# Patient Record
Sex: Male | Born: 1975 | Race: Black or African American | Hispanic: No
Health system: Southern US, Community
[De-identification: ages and names within clinical notes are randomized; demographics above are authoritative.]

## PROBLEM LIST (undated history)

## (undated) DIAGNOSIS — M1990 Unspecified osteoarthritis, unspecified site: Secondary | ICD-10-CM

## (undated) DIAGNOSIS — F32A Depression, unspecified: Secondary | ICD-10-CM

## (undated) DIAGNOSIS — M5126 Other intervertebral disc displacement, lumbar region: Secondary | ICD-10-CM

## (undated) DIAGNOSIS — I10 Essential (primary) hypertension: Secondary | ICD-10-CM

## (undated) HISTORY — DX: Depression, unspecified: F32.A

---

## 2016-08-18 ENCOUNTER — Encounter (HOSPITAL_COMMUNITY): Payer: Self-pay

## 2016-08-18 ENCOUNTER — Emergency Department (HOSPITAL_COMMUNITY)
Admission: EM | Admit: 2016-08-18 | Discharge: 2016-08-18 | Disposition: A | Discharge status: Home / Self Care | Payer: Worker's Compensation | Attending: Emergency Medicine | Admitting: Emergency Medicine

## 2016-08-18 DIAGNOSIS — Y999 Unspecified external cause status: Secondary | ICD-10-CM | POA: Diagnosis not present

## 2016-08-18 DIAGNOSIS — Y9289 Other specified places as the place of occurrence of the external cause: Secondary | ICD-10-CM | POA: Diagnosis not present

## 2016-08-18 DIAGNOSIS — X509XXA Other and unspecified overexertion or strenuous movements or postures, initial encounter: Secondary | ICD-10-CM | POA: Diagnosis not present

## 2016-08-18 DIAGNOSIS — M545 Low back pain, unspecified: Secondary | ICD-10-CM

## 2016-08-18 DIAGNOSIS — Y9389 Activity, other specified: Secondary | ICD-10-CM | POA: Diagnosis not present

## 2016-08-18 DIAGNOSIS — I10 Essential (primary) hypertension: Secondary | ICD-10-CM | POA: Insufficient documentation

## 2016-08-18 DIAGNOSIS — F17200 Nicotine dependence, unspecified, uncomplicated: Secondary | ICD-10-CM | POA: Diagnosis not present

## 2016-08-18 HISTORY — DX: Essential (primary) hypertension: I10

## 2016-08-18 HISTORY — DX: Other intervertebral disc displacement, lumbar region: M51.26

## 2016-08-18 MED ORDER — KETOROLAC TROMETHAMINE 60 MG/2ML IM SOLN
30.0000 mg | Freq: Once | INTRAMUSCULAR | Status: AC
  Administered 2016-08-18: 30 mg via INTRAMUSCULAR
  Filled 2016-08-18: qty 2

## 2016-08-18 MED ORDER — METHOCARBAMOL 500 MG PO TABS: 500.0000 mg | ORAL_TABLET | Freq: Two times a day (BID) | ORAL | 0 refills | Status: DC

## 2016-08-18 MED ORDER — NAPROXEN 500 MG PO TABS: 500.0000 mg | ORAL_TABLET | Freq: Two times a day (BID) | ORAL | 0 refills | Status: AC

## 2016-08-18 NOTE — ED Provider Notes (Signed)
[facility]-EMERGENCY DEPT Provider Note   CSN: [id number] Arrival date & time: 08/18/16  2053  By signing my name below, I, Rodney Gallagher, attest that this documentation has been prepared under the direction and in the presence of non-physician practitioner, Glenford Bayley, PA-C. Electronically Signed: Modena Gallagher, Scribe. 08/18/2016. 9:31 PM.  History   Chief Complaint Chief Complaint  Patient presents with  . Back Pain   The history is provided by the patient. No language interpreter was used.   HPI Comments: Rodney Gallagher is a 41 y.o. male with history of lumbar disc herniation who presents to the Emergency Department complaining of constant moderate lower left back pain that started today after lifting an object at work. Patient also reports he was shoveling sand all day yesterday. He takes tramadol and naproxen (last dose: 3pm today) daily for a herniated lumbar disc (followed up by PCP). His pain today was unrelieved his daily pain medications. He describes the pain as a sharp sensation exacerbated by movement. He denies any gait problem, numbness/tingling, bowel/bladder incontinence, saddle paresthesia, recent surgery, hx of IV drug use, hx of cancer, or other complaints.   Past Medical History:  Diagnosis Date  . Herniation of lumbar intervertebral disc   . Hypertension     There are no active problems to display for this patient.   History reviewed. No pertinent surgical history.     Home Medications    Prior to Admission medications   Medication Sig Start Date End Date Taking? Authorizing Provider  methocarbamol (ROBAXIN) 500 MG tablet Take 1 tablet (500 mg total) by mouth 2 (two) times daily. 08/18/16   Emi Holes, PA-C  naproxen (NAPROSYN) 500 MG tablet Take 1 tablet (500 mg total) by mouth 2 (two) times daily. 08/18/16   Emi Holes, PA-C    Family History No family history on file.  Social History Social History  Substance Use Topics  . Smoking status: Current  Every Day Smoker  . Smokeless tobacco: Never Used  . Alcohol use No     Allergies   Patient has no known allergies.   Review of Systems Review of Systems  Constitutional: Negative for chills and fever.  HENT: Negative for facial swelling and sore throat.   Respiratory: Negative for shortness of breath.   Cardiovascular: Negative for chest pain.  Gastrointestinal: Negative for abdominal pain, nausea and vomiting.  Genitourinary: Negative for dysuria.  Musculoskeletal: Positive for back pain (Lower, left). Negative for gait problem.  Skin: Negative for rash and wound.  Neurological: Negative for numbness and headaches.  Psychiatric/Behavioral: The patient is not nervous/anxious.      Physical Exam Updated Vital Signs BP (!) 164/109 (BP Location: Right Arm)   Pulse 78   Temp 97.7 F (36.5 C) (Oral)   Resp 18   Ht 5\' 11"  (1.803 m)   Wt 250 lb (113.4 kg)   SpO2 98%   BMI 34.87 kg/m   Physical Exam  Constitutional: He appears well-developed and well-nourished. No distress.  Patient ambulating without difficulty  HENT:  Head: Normocephalic and atraumatic.  Mouth/Throat: Oropharynx is clear and moist. No oropharyngeal exudate.  Eyes: Conjunctivae are normal. Pupils are equal, round, and reactive to light. Right eye exhibits no discharge. Left eye exhibits no discharge. No scleral icterus.  Neck: Normal range of motion. Neck supple. No thyromegaly present.  Cardiovascular: Normal rate, regular rhythm, normal heart sounds and intact distal pulses.  Exam reveals no gallop and no friction rub.   No murmur  heard. Pulmonary/Chest: Effort normal and breath sounds normal. No stridor. No respiratory distress. He has no wheezes. He has no rales.  Abdominal: Soft. Bowel sounds are normal. He exhibits no distension. There is no tenderness. There is no rebound and no guarding.  Musculoskeletal: He exhibits no edema.  Left lumbar paraspinal tenderness, no midline tenderness; pain  improved with lumbar flexion and extension, worsened with lateral flexion  Lymphadenopathy:    He has no cervical adenopathy.  Neurological: He is alert. Coordination normal.  Normal sensation and 5/5 strength lower extremities; 2+ patellar reflexes  Skin: Skin is warm and dry. No rash noted. He is not diaphoretic. No pallor.  Psychiatric: He has a normal mood and affect.  Nursing note and vitals reviewed.    ED Treatments / Results  ,DIAGNOSTIC STUDIES: Oxygen Saturation is 98% on RA, normal by my interpretation.    COORDINATION OF CARE: 9:35 PM- Pt advised of plan for treatment and pt agrees.  Labs (all labs ordered are listed, but only abnormal results are displayed) Labs Reviewed - No data to display  EKG  EKG Interpretation None       Radiology No results found.  Procedures Procedures (including critical care time)  Medications Ordered in ED Medications  ketorolac (TORADOL) injection 30 mg (30 mg Intramuscular Given 08/18/16 2254)     Initial Impression / Assessment and Plan / ED Course  I have reviewed the triage vital signs and the nursing notes.  Pertinent labs & imaging results that were available during my care of the patient were reviewed by me and considered in my medical decision making (see chart for details).  Clinical Course     Patient with back pain. History of disc herniation, however no neurological deficits and normal neuro exam.  Patient is ambulatory.  No loss of bowel or bladder control.  No concern for cauda equina.  No fever, night sweats, weight loss, h/o cancer, IVDA, no recent procedure to back. No urinary symptoms suggestive of UTI. No midline tenderness or other indication for imaging at this time. Supportive care and return precaution discussed. I discharged patient home with 500 mg Naprosyn. I advised that he could take this instead if his current dose is less than this. I also added Robaxin. Supportive treatment including ice and heat  as well as exercises and stretches discussed. Follow up with PCP next week.  Appears safe for discharge at this time.    Final Clinical Impressions(s) / ED Diagnoses   Final diagnoses:  Acute left-sided low back pain without sciatica    New Prescriptions New Prescriptions   METHOCARBAMOL (ROBAXIN) 500 MG TABLET    Take 1 tablet (500 mg total) by mouth 2 (two) times daily.   NAPROXEN (NAPROSYN) 500 MG TABLET    Take 1 tablet (500 mg total) by mouth 2 (two) times daily.   I personally performed the services described in this documentation, which was scribed in my presence. The recorded information has been reviewed and is accurate.     Emi Holeslexandra M Daanish Copes, PA-C 08/18/16 [id number]    Azalia BilisKevin Campos, MD 08/19/16 [phone]

## 2016-08-18 NOTE — ED Triage Notes (Signed)
Pt states that today he was lifting something today at work and felt a sharp pain in the L lower part of his back, denies numbness or tingling in his legs. Pain not relieved by OTC medications.

## 2016-08-18 NOTE — ED Notes (Signed)
ED Provider at bedside. 

## 2016-08-18 NOTE — Discharge Instructions (Signed)
Medications: Naprosyn, Robaxin  Treatment: If your prescription for Naprosyn is less than 500 mg at home, you can take this prescription instead as prescribed. You can continue taking your tramadol as prescribed. Take Robaxin twice daily as needed for muscle pain and spasms. Do not drive or operate machinery when taking this medication. Use ice 3-4 times daily alternating 20 minutes on, 20 minutes off. Attempt the back stretches attached as tolerated.  Follow-up: Please follow-up with your primary care provider next week for follow-up and further evaluation of your symptoms. Please return to emergency department if you develop any new or worsening symptoms including numbness in your groin, loss of bowel or bladder control, inability to walk, or complete numbness in your lower extremities.

## 2016-08-19 ENCOUNTER — Encounter (HOSPITAL_COMMUNITY): Payer: Self-pay | Admitting: Emergency Medicine

## 2016-08-19 ENCOUNTER — Emergency Department (HOSPITAL_COMMUNITY)
Admission: EM | Admit: 2016-08-19 | Discharge: 2016-08-19 | Disposition: A | Discharge status: Home / Self Care | Payer: Worker's Compensation | Attending: Emergency Medicine | Admitting: Emergency Medicine

## 2016-08-19 DIAGNOSIS — M62838 Other muscle spasm: Secondary | ICD-10-CM | POA: Diagnosis not present

## 2016-08-19 DIAGNOSIS — S3992XA Unspecified injury of lower back, initial encounter: Secondary | ICD-10-CM | POA: Diagnosis present

## 2016-08-19 DIAGNOSIS — Y939 Activity, unspecified: Secondary | ICD-10-CM | POA: Diagnosis not present

## 2016-08-19 DIAGNOSIS — Z79899 Other long term (current) drug therapy: Secondary | ICD-10-CM | POA: Diagnosis not present

## 2016-08-19 DIAGNOSIS — S39012A Strain of muscle, fascia and tendon of lower back, initial encounter: Secondary | ICD-10-CM | POA: Diagnosis not present

## 2016-08-19 DIAGNOSIS — Y990 Civilian activity done for income or pay: Secondary | ICD-10-CM | POA: Insufficient documentation

## 2016-08-19 DIAGNOSIS — F17200 Nicotine dependence, unspecified, uncomplicated: Secondary | ICD-10-CM | POA: Diagnosis not present

## 2016-08-19 DIAGNOSIS — X500XXA Overexertion from strenuous movement or load, initial encounter: Secondary | ICD-10-CM | POA: Diagnosis not present

## 2016-08-19 DIAGNOSIS — I10 Essential (primary) hypertension: Secondary | ICD-10-CM | POA: Diagnosis not present

## 2016-08-19 DIAGNOSIS — Y929 Unspecified place or not applicable: Secondary | ICD-10-CM | POA: Insufficient documentation

## 2016-08-19 DIAGNOSIS — M62830 Muscle spasm of back: Secondary | ICD-10-CM

## 2016-08-19 MED ORDER — HYDROMORPHONE HCL 2 MG/ML IJ SOLN
1.0000 mg | Freq: Once | INTRAMUSCULAR | Status: AC
  Administered 2016-08-19: 1 mg via INTRAMUSCULAR
  Filled 2016-08-19: qty 1

## 2016-08-19 MED ORDER — PREDNISONE 10 MG (21) PO TBPK: 10.0000 mg | ORAL_TABLET | Freq: Every day | ORAL | 0 refills | Status: DC

## 2016-08-19 MED ORDER — OXYCODONE-ACETAMINOPHEN 5-325 MG PO TABS: 1.0000 | ORAL_TABLET | Freq: Four times a day (QID) | ORAL | 0 refills | Status: DC | PRN

## 2016-08-19 NOTE — Discharge Instructions (Signed)
Medications: Percocet, prednisone  Treatment: Take 1-2 Percocet every 6 hours as needed for severe pain. Do not take tramadol with this medication, however he can take Naprosyn. Take prednisone as prescribed for 12 days.  Follow-up: Please follow-up with your primary care provider and/or your orthopedic doctor for further evaluation and treatment of your symptoms. Please return to emergency department if you develop any new or worsening symptoms including loss of bowel or other control, numbness in your groin, or inability to walk.

## 2016-08-19 NOTE — ED Notes (Signed)
Pt also states he is taking tramadol at home for other chronic pain

## 2016-08-19 NOTE — ED Notes (Signed)
Pt walks fine in to room from waiting room. When we get in the room he tells me "i was here last night to get workmen's comp." And was told to go home and ice his back. I tell him to get in the chair and he starts to sit then slowly sits on his knees telling me "my back locked up." I waved EMT, kimberly, to come to the room to assist. He then tells us "all the hospital cares about is money." we explained that has nothing to do with us providing care for him. We placed pt in wheel chair and covered him with warm blankets to provide comfort and gave him call bell.

## 2016-08-19 NOTE — ED Provider Notes (Signed)
[facility] Provider Note   CSN: [id number] Arrival date & time: 08/19/16  1739  By signing my name below, I, Modena Jansky, attest that this documentation has been prepared under the direction and in the presence of non-physician practitioner, Glenford Bayley, PA-C. Electronically Signed: Modena Jansky, Scribe. 08/19/2016. 8:34 PM.  History   Chief Complaint Chief Complaint  Patient presents with  . Back Pain   The history is provided by the patient and medical records. No language interpreter was used.   HPI Comments: Rodney Gallagher is a 41 y.o. male with hx of lumbar disc herniation who presents to the Emergency Department complaining of constant moderate lower left back pain that started yesterday after lifting an object at work and shoveling sand all day yesterday. He was seen in the ED on 08/19/15 for the same complaint. His pain has been worsening since his back "locked up" yesterday. He takes tramadol and naproxen daily for a herniated lumbar disc (followed up by PCP). His pain was unrelieved his daily pain medications and flexeril (taken after yesterday's ED visit). He describes the pain as a sharp sensation exacerbated by movement. He denies any gait problem, numbness/tingling, bowel/bladder incontinence, saddle paresthesia, recent surgery or procedure to back, hx of IV drug use, hx of cancer, or other complaints.   Past Medical History:  Diagnosis Date  . Herniation of lumbar intervertebral disc   . Hypertension     There are no active problems to display for this patient.   History reviewed. No pertinent surgical history.     Home Medications    Prior to Admission medications   Medication Sig Start Date End Date Taking? Authorizing Provider  methocarbamol (ROBAXIN) 500 MG tablet Take 1 tablet (500 mg total) by mouth 2 (two) times daily. 08/18/16   Emi Holes, PA-C  naproxen (NAPROSYN) 500 MG tablet Take 1 tablet (500 mg total) by mouth 2 (two) times daily. 08/18/16    Emi Holes, PA-C  oxyCODONE-acetaminophen (PERCOCET/ROXICET) 5-325 MG tablet Take 1-2 tablets by mouth every 6 (six) hours as needed for severe pain. 08/19/16   Emi Holes, PA-C  predniSONE (STERAPRED UNI-PAK 21 TAB) 10 MG (21) TBPK tablet Take 1 tablet (10 mg total) by mouth daily. Take 6 tabs by mouth daily  for 2 days, then 5 tabs for 2 days, then 4 tabs for 2 days, then 3 tabs for 2 days, 2 tabs for 2 days, then 1 tab by mouth daily for 2 days 08/19/16   Emi Holes, PA-C    Family History History reviewed. No pertinent family history.  Social History Social History  Substance Use Topics  . Smoking status: Current Every Day Smoker    Packs/day: 0.50  . Smokeless tobacco: Never Used  . Alcohol use No     Allergies   Patient has no known allergies.   Review of Systems Review of Systems  Constitutional: Negative for chills and fever.  HENT: Negative for facial swelling and sore throat.   Respiratory: Negative for shortness of breath.   Cardiovascular: Negative for chest pain.  Gastrointestinal: Negative for abdominal pain, nausea and vomiting.  Genitourinary: Negative for dysuria.  Musculoskeletal: Positive for back pain (Lower, left). Negative for gait problem.  Skin: Negative for rash and wound.  Psychiatric/Behavioral: The patient is not nervous/anxious.     Physical Exam Updated Vital Signs BP (!) 177/109 (BP Location: Left Arm)   Pulse 65   Temp 98.6 F (37 C) (Oral)   Resp  18   Ht 5\' 11"  (1.803 m)   Wt 230 lb (104.3 kg)   SpO2 100%   BMI 32.08 kg/m   Physical Exam  Constitutional: He appears well-developed and well-nourished. No distress.  Patient ambulating without difficulty.  HENT:  Head: Normocephalic and atraumatic.  Mouth/Throat: Oropharynx is clear and moist. No oropharyngeal exudate.  Eyes: Conjunctivae are normal. Pupils are equal, round, and reactive to light. Right eye exhibits no discharge. Left eye exhibits no discharge. No scleral  icterus.  Neck: Normal range of motion. Neck supple. No thyromegaly present.  Cardiovascular: Normal rate, regular rhythm, normal heart sounds and intact distal pulses.  Exam reveals no gallop and no friction rub.   No murmur heard. Pulmonary/Chest: Effort normal and breath sounds normal. No stridor. No respiratory distress. He has no wheezes. He has no rales.  Abdominal: Soft. Bowel sounds are normal. He exhibits no distension. There is no tenderness. There is no rebound and no guarding.  Musculoskeletal: He exhibits no edema.  Left lumbar paraspinal tenderness, no midline tenderness; pain improved with lumbar flexion and extension, worsened with lateral flexion; patient can heel raise and toe raise with some pain  Lymphadenopathy:    He has no cervical adenopathy.  Neurological: He is alert. Coordination normal.  Normal sensation and 5/5 strength lower extremities; 2+ patellar reflexes   Skin: Skin is warm and dry. No rash noted. He is not diaphoretic. No pallor.  Psychiatric: He has a normal mood and affect.  Nursing note and vitals reviewed.    ED Treatments / Results  DIAGNOSTIC STUDIES: Oxygen Saturation is 100% on RA, normal by my interpretation.    COORDINATION OF CARE: 8:39 PM- Pt advised of plan for treatment and pt agrees.  Labs (all labs ordered are listed, but only abnormal results are displayed) Labs Reviewed - No data to display  EKG  EKG Interpretation None       Radiology No results found.  Procedures Procedures (including critical care time)  Medications Ordered in ED Medications  HYDROmorphone (DILAUDID) injection 1 mg (not administered)     Initial Impression / Assessment and Plan / ED Course  I have reviewed the triage vital signs and the nursing notes.  Pertinent labs & imaging results that were available during my care of the patient were reviewed by me and considered in my medical decision making (see chart for details).  Clinical Course       Patient returning with back pain. No change in symptoms from yesterday's evaluation. I discussed that MRI is only used for emergent, life-threatening conditions. No neurological deficits and normal neuro exam.  Patient is ambulatory.  No loss of bowel or bladder control.  No concern for cauda equina.  No fever, night sweats, weight loss, h/o cancer, IVDA, no recent procedure to back. No urinary symptoms suggestive of UTI.  I'll discharge patient home with Percocet and prednisone taper. I reviewed and see narcotic database and found discrepancies. I advised patient only take medication as prescribed, not to drive or operate machinery, and not share medication with anyone else. Supportive care and return precaution discussed. Follow up with patient's orthopedic doctor and/or primary care provider. Patient understands and agrees with plan. I discussed patient case with Dr. Adriana Simas who guided the patient's management and agrees with plan.    Final Clinical Impressions(s) / ED Diagnoses   Final diagnoses:  Strain of lumbar region, initial encounter  Muscle spasm of back    New Prescriptions New Prescriptions  OXYCODONE-ACETAMINOPHEN (PERCOCET/ROXICET) 5-325 MG TABLET    Take 1-2 tablets by mouth every 6 (six) hours as needed for severe pain.   PREDNISONE (STERAPRED UNI-PAK 21 TAB) 10 MG (21) TBPK TABLET    Take 1 tablet (10 mg total) by mouth daily. Take 6 tabs by mouth daily  for 2 days, then 5 tabs for 2 days, then 4 tabs for 2 days, then 3 tabs for 2 days, 2 tabs for 2 days, then 1 tab by mouth daily for 2 days   I personally performed the services described in this documentation, which was scribed in my presence. The recorded information has been reviewed and is accurate.     Emi Holeslexandra M Rashed Edler, PA-C 08/19/16 2055    Donnetta HutchingBrian Cook, MD 08/20/16 [phone]

## 2016-08-19 NOTE — ED Triage Notes (Signed)
Pt presents to ED for reassessment of back pain starting last night.  Pt was seen here and given a prescription for naprosyn and robaxin, which are still attached to his paperwork.  Pt sts no relief at home.

## 2018-08-21 ENCOUNTER — Other Ambulatory Visit: Payer: Self-pay | Admitting: Neurological Surgery

## 2018-09-10 ENCOUNTER — Other Ambulatory Visit (HOSPITAL_COMMUNITY): Payer: Self-pay

## 2018-09-10 NOTE — Pre-Procedure Instructions (Signed)
Andrews Devan  09/10/2018      [facility] - [place], [place] - [address] [place] [address] Phone: [phone] Fax: [phone]    Your procedure is scheduled on Wednesday, February 5th.  Report to [facility] Admitting at 10:15 A.M.  Call this number if you have problems the morning of surgery:  [phone]   Remember:  Do not eat or drink after midnight.    Take these medicines the morning of surgery with A SIP OF WATER  DULoxetine (CYMBALTA)  gabapentin (NEURONTIN)  traMADol (ULTRAM)-as needed for pain.  As of today,STOP taking any Aspirin (unless otherwise instructed by your surgeon), Aleve, Naproxen, Ibuprofen, Motrin, Advil, Goody's, BC's, all herbal medications, fish oil, and all vitamins.    Do not wear jewelry.  Do not wear lotions, powders, or colognes, or deodorant.  Men may shave face and neck.  Do not bring valuables to the hospital.  [facility] is not responsible for any belongings or valuables.  Contacts, dentures or bridgework may not be worn into surgery.  Leave your suitcase in the car.  After surgery it may be brought to your room.  For patients admitted to the hospital, discharge time will be determined by your treatment team.  Patients discharged the day of surgery will not be allowed to drive home.   Special instructions:   [place]- Preparing For Surgery  Before surgery, you can play an important role. Because skin is not sterile, your skin needs to be as free of germs as possible. You can reduce the number of germs on your skin by washing with CHG (chlorahexidine gluconate) Soap before surgery.  CHG is an antiseptic cleaner which kills germs and bonds with the skin to continue killing germs even after washing.    Oral Hygiene is also important to reduce your risk of infection.  Remember - BRUSH YOUR TEETH THE MORNING OF SURGERY WITH YOUR REGULAR TOOTHPASTE  Please do not use if you  have an allergy to CHG or antibacterial soaps. If your skin becomes reddened/irritated stop using the CHG.  Do not shave (including legs and underarms) for at least 48 hours prior to first CHG shower. It is OK to shave your face.  Please follow these instructions carefully.   1. Shower the NIGHT BEFORE SURGERY and the MORNING OF SURGERY with CHG.   2. If you chose to wash your hair, wash your hair first as usual with your normal shampoo.  3. After you shampoo, rinse your hair and body thoroughly to remove the shampoo.  4. Use CHG as you would any other liquid soap. You can apply CHG directly to the skin and wash gently with a scrungie or a clean washcloth.   5. Apply the CHG Soap to your body ONLY FROM THE NECK DOWN.  Do not use on open wounds or open sores. Avoid contact with your eyes, ears, mouth and genitals (private parts). Wash Face and genitals (private parts)  with your normal soap.  6. Wash thoroughly, paying special attention to the area where your surgery will be performed.  7. Thoroughly rinse your body with warm water from the neck down.  8. DO NOT shower/wash with your normal soap after using and rinsing off the CHG Soap.  9. Pat yourself dry with a CLEAN TOWEL.  10. Wear CLEAN PAJAMAS to bed the night before surgery, wear comfortable clothes the morning of surgery  11. Place CLEAN SHEETS on  your bed the night of your first shower and DO NOT SLEEP WITH PETS.    Day of Surgery:  Do not apply any deodorants/lotions.  Please wear clean clothes to the hospital/surgery center.   Remember to brush your teeth WITH YOUR REGULAR TOOTHPASTE.   Please read over the following fact sheets that you were given.

## 2018-09-11 ENCOUNTER — Encounter (HOSPITAL_COMMUNITY): Payer: Self-pay

## 2018-09-11 ENCOUNTER — Encounter (HOSPITAL_COMMUNITY)
Admission: RE | Admit: 2018-09-11 | Discharge: 2018-09-11 | Disposition: A | Discharge status: Home / Self Care | Payer: Medicaid Other | Source: Ambulatory Visit | Attending: Neurological Surgery | Admitting: Neurological Surgery

## 2018-09-11 ENCOUNTER — Other Ambulatory Visit: Payer: Self-pay

## 2018-09-11 DIAGNOSIS — Z01818 Encounter for other preprocedural examination: Secondary | ICD-10-CM | POA: Diagnosis present

## 2018-09-11 DIAGNOSIS — M4310 Spondylolisthesis, site unspecified: Secondary | ICD-10-CM

## 2018-09-11 HISTORY — DX: Unspecified osteoarthritis, unspecified site: M19.90

## 2018-09-11 LAB — CBC WITH DIFFERENTIAL/PLATELET
Abs Immature Granulocytes: 0.02 10*3/uL (ref 0.00–0.07)
Basophils Absolute: 0 10*3/uL (ref 0.0–0.1)
Basophils Relative: 1 %
Eosinophils Absolute: 0.2 10*3/uL (ref 0.0–0.5)
Eosinophils Relative: 3 %
HCT: 41.5 % (ref 39.0–52.0)
HEMOGLOBIN: 13.2 g/dL (ref 13.0–17.0)
Immature Granulocytes: 0 %
LYMPHS PCT: 33 %
Lymphs Abs: 2.3 10*3/uL (ref 0.7–4.0)
MCH: 27 pg (ref 26.0–34.0)
MCHC: 31.8 g/dL (ref 30.0–36.0)
MCV: 84.9 fL (ref 80.0–100.0)
Monocytes Absolute: 0.6 10*3/uL (ref 0.1–1.0)
Monocytes Relative: 8 %
Neutro Abs: 3.8 10*3/uL (ref 1.7–7.7)
Neutrophils Relative %: 55 %
Platelets: 333 10*3/uL (ref 150–400)
RBC: 4.89 MIL/uL (ref 4.22–5.81)
RDW: 14.4 % (ref 11.5–15.5)
WBC: 7 10*3/uL (ref 4.0–10.5)
nRBC: 0 % (ref 0.0–0.2)

## 2018-09-11 LAB — HEPATIC FUNCTION PANEL
ALT: 34 U/L (ref 0–44)
AST: 30 U/L (ref 15–41)
Albumin: 4.1 g/dL (ref 3.5–5.0)
Alkaline Phosphatase: 74 U/L (ref 38–126)
Bilirubin, Direct: 0.1 mg/dL (ref 0.0–0.2)
Indirect Bilirubin: 0.5 mg/dL (ref 0.3–0.9)
Total Bilirubin: 0.6 mg/dL (ref 0.3–1.2)
Total Protein: 7.7 g/dL (ref 6.5–8.1)

## 2018-09-11 LAB — TYPE AND SCREEN
ABO/RH(D): A POS
Antibody Screen: NEGATIVE

## 2018-09-11 LAB — SURGICAL PCR SCREEN
MRSA, PCR: NEGATIVE
Staphylococcus aureus: NEGATIVE

## 2018-09-11 LAB — BASIC METABOLIC PANEL
Anion gap: 9 (ref 5–15)
BUN: 11 mg/dL (ref 6–20)
CO2: 23 mmol/L (ref 22–32)
Calcium: 8.7 mg/dL — ABNORMAL LOW (ref 8.9–10.3)
Chloride: 103 mmol/L (ref 98–111)
Creatinine, Ser: 0.91 mg/dL (ref 0.61–1.24)
GFR calc Af Amer: >60 mL/min (ref >60)
GFR calc non Af Amer: >60 mL/min (ref >60)
Glucose, Bld: 127 mg/dL — ABNORMAL HIGH (ref 70–99)
Potassium: 3.8 mmol/L (ref 3.5–5.1)
Sodium: 135 mmol/L (ref 135–145)

## 2018-09-11 LAB — PROTIME-INR
INR: 0.94
Prothrombin Time: 12.5 seconds (ref 11.4–15.2)

## 2018-09-11 LAB — ABO/RH: ABO/RH(D): A POS

## 2018-09-11 NOTE — Progress Notes (Addendum)
PCP - Casimer Bilis [facility] Cardiologist - denies  Chest x-ray - 09-11-18 EKG - 09-11-18  Pt stated he uses marijuana and CBD every day for pain relief. Instructed not to smoke 24 hours prior to surgery.  Patient denies shortness of breath, fever, cough and chest pain at PAT appointment  Patient verbalized understanding of instructions that were given to them at the PAT appointment. Patient was also instructed that they will need to review over the PAT instructions again at home before surgery.

## 2018-09-11 NOTE — Progress Notes (Signed)
For labs at pre-admission: original order was for BMP.  Pt stated he is an everyday marijuana user.    Needed to change BMP to CMP, but forgot to release the order for CMP.  Called lab and added hepatic function panel to BMP to make it a CMP.

## 2018-09-17 MED ORDER — DEXTROSE 5 % IV SOLN
3.0000 g | INTRAVENOUS | Status: AC
  Administered 2018-09-18: 2 g via INTRAVENOUS
  Filled 2018-09-17: qty 3

## 2018-09-18 ENCOUNTER — Encounter (HOSPITAL_COMMUNITY): Payer: Self-pay | Admitting: *Deleted

## 2018-09-18 ENCOUNTER — Inpatient Hospital Stay (HOSPITAL_COMMUNITY): Payer: Medicaid Other

## 2018-09-18 ENCOUNTER — Inpatient Hospital Stay (HOSPITAL_COMMUNITY)
Admission: RE | Admit: 2018-09-18 | Discharge: 2018-09-20 | DRG: 455 | Disposition: A | Discharge status: Home / Self Care | Payer: Medicaid Other | Attending: Neurological Surgery | Admitting: Neurological Surgery

## 2018-09-18 ENCOUNTER — Encounter (HOSPITAL_COMMUNITY)
Admission: RE | Disposition: A | Discharge status: Home / Self Care | Payer: Self-pay | Source: Home / Self Care | Attending: Neurological Surgery

## 2018-09-18 DIAGNOSIS — M1990 Unspecified osteoarthritis, unspecified site: Secondary | ICD-10-CM | POA: Diagnosis present

## 2018-09-18 DIAGNOSIS — Z79891 Long term (current) use of opiate analgesic: Secondary | ICD-10-CM

## 2018-09-18 DIAGNOSIS — Z791 Long term (current) use of non-steroidal anti-inflammatories (NSAID): Secondary | ICD-10-CM

## 2018-09-18 DIAGNOSIS — M4316 Spondylolisthesis, lumbar region: Secondary | ICD-10-CM | POA: Diagnosis present

## 2018-09-18 DIAGNOSIS — Z87891 Personal history of nicotine dependence: Secondary | ICD-10-CM

## 2018-09-18 DIAGNOSIS — Z6839 Body mass index (BMI) 39.0-39.9, adult: Secondary | ICD-10-CM | POA: Diagnosis not present

## 2018-09-18 DIAGNOSIS — Z981 Arthrodesis status: Secondary | ICD-10-CM

## 2018-09-18 DIAGNOSIS — M48061 Spinal stenosis, lumbar region without neurogenic claudication: Secondary | ICD-10-CM | POA: Diagnosis present

## 2018-09-18 DIAGNOSIS — E669 Obesity, unspecified: Secondary | ICD-10-CM | POA: Diagnosis present

## 2018-09-18 DIAGNOSIS — Z419 Encounter for procedure for purposes other than remedying health state, unspecified: Secondary | ICD-10-CM

## 2018-09-18 DIAGNOSIS — Z79899 Other long term (current) drug therapy: Secondary | ICD-10-CM | POA: Diagnosis not present

## 2018-09-18 HISTORY — PX: LUMBAR FUSION: SHX111

## 2018-09-18 SURGERY — POSTERIOR LUMBAR FUSION 1 LEVEL
Anesthesia: General | Site: Spine Lumbar

## 2018-09-18 MED ORDER — THROMBIN 20000 UNITS EX SOLR
CUTANEOUS | Status: AC
  Filled 2018-09-18: qty 20000

## 2018-09-18 MED ORDER — SODIUM CHLORIDE 0.9 % IV SOLN
INTRAVENOUS | Status: DC | PRN
  Administered 2018-09-18: 500 mL

## 2018-09-18 MED ORDER — 0.9 % SODIUM CHLORIDE (POUR BTL) OPTIME
TOPICAL | Status: DC | PRN
  Administered 2018-09-18: 1000 mL

## 2018-09-18 MED ORDER — FENTANYL CITRATE (PF) 250 MCG/5ML IJ SOLN
INTRAMUSCULAR | Status: AC
  Filled 2018-09-18: qty 5

## 2018-09-18 MED ORDER — PHENYLEPHRINE 40 MCG/ML (10ML) SYRINGE FOR IV PUSH (FOR BLOOD PRESSURE SUPPORT)
PREFILLED_SYRINGE | INTRAVENOUS | Status: AC
  Filled 2018-09-18: qty 10

## 2018-09-18 MED ORDER — METHOCARBAMOL 500 MG PO TABS
500.0000 mg | ORAL_TABLET | Freq: Four times a day (QID) | ORAL | Status: DC | PRN
  Administered 2018-09-18 – 2018-09-20 (×5): 500 mg via ORAL
  Filled 2018-09-18 (×6): qty 1

## 2018-09-18 MED ORDER — HYDROMORPHONE HCL 1 MG/ML IJ SOLN
INTRAMUSCULAR | Status: AC
  Filled 2018-09-18: qty 1

## 2018-09-18 MED ORDER — ONDANSETRON HCL 4 MG/2ML IJ SOLN: 4.0000 mg | Freq: Four times a day (QID) | INTRAMUSCULAR | Status: DC | PRN

## 2018-09-18 MED ORDER — HEPARIN SODIUM (PORCINE) 1000 UNIT/ML IJ SOLN
INTRAMUSCULAR | Status: AC
  Filled 2018-09-18: qty 1

## 2018-09-18 MED ORDER — ROCURONIUM BROMIDE 50 MG/5ML IV SOSY
PREFILLED_SYRINGE | INTRAVENOUS | Status: AC
  Filled 2018-09-18: qty 5

## 2018-09-18 MED ORDER — FENTANYL CITRATE (PF) 100 MCG/2ML IJ SOLN
INTRAMUSCULAR | Status: DC | PRN
  Administered 2018-09-18: 100 ug via INTRAVENOUS
  Administered 2018-09-18 (×4): 50 ug via INTRAVENOUS
  Administered 2018-09-18: 100 ug via INTRAVENOUS
  Administered 2018-09-18 (×2): 50 ug via INTRAVENOUS

## 2018-09-18 MED ORDER — MIDAZOLAM HCL 2 MG/2ML IJ SOLN
INTRAMUSCULAR | Status: AC
  Filled 2018-09-18: qty 2

## 2018-09-18 MED ORDER — DIAZEPAM 5 MG/ML IJ SOLN
INTRAMUSCULAR | Status: AC
  Filled 2018-09-18: qty 2

## 2018-09-18 MED ORDER — METHOCARBAMOL 500 MG PO TABS
ORAL_TABLET | ORAL | Status: AC
  Filled 2018-09-18: qty 1

## 2018-09-18 MED ORDER — PHENYLEPHRINE 40 MCG/ML (10ML) SYRINGE FOR IV PUSH (FOR BLOOD PRESSURE SUPPORT)
PREFILLED_SYRINGE | INTRAVENOUS | Status: DC | PRN
  Administered 2018-09-18 (×2): 80 ug via INTRAVENOUS

## 2018-09-18 MED ORDER — PHENOL 1.4 % MT LIQD: 1.0000 | OROMUCOSAL | Status: DC | PRN

## 2018-09-18 MED ORDER — HYDROMORPHONE HCL 1 MG/ML IJ SOLN
INTRAMUSCULAR | Status: AC
  Administered 2018-09-18: 0.5 mg via INTRAVENOUS
  Filled 2018-09-18: qty 1

## 2018-09-18 MED ORDER — MIDAZOLAM HCL 5 MG/5ML IJ SOLN
INTRAMUSCULAR | Status: DC | PRN
  Administered 2018-09-18: 2 mg via INTRAVENOUS

## 2018-09-18 MED ORDER — BUPIVACAINE HCL (PF) 0.25 % IJ SOLN
INTRAMUSCULAR | Status: DC | PRN
  Administered 2018-09-18: 4 mL

## 2018-09-18 MED ORDER — CEFAZOLIN SODIUM-DEXTROSE 2-4 GM/100ML-% IV SOLN
2.0000 g | Freq: Three times a day (TID) | INTRAVENOUS | Status: AC
  Administered 2018-09-18 – 2018-09-19 (×2): 2 g via INTRAVENOUS
  Filled 2018-09-18 (×2): qty 100

## 2018-09-18 MED ORDER — SODIUM CHLORIDE 0.9% FLUSH: 3.0000 mL | INTRAVENOUS | Status: DC | PRN

## 2018-09-18 MED ORDER — MENTHOL 3 MG MT LOZG: 1.0000 | LOZENGE | OROMUCOSAL | Status: DC | PRN

## 2018-09-18 MED ORDER — LIDOCAINE 2% (20 MG/ML) 5 ML SYRINGE
INTRAMUSCULAR | Status: AC
  Filled 2018-09-18: qty 5

## 2018-09-18 MED ORDER — ONDANSETRON HCL 4 MG PO TABS: 4.0000 mg | ORAL_TABLET | Freq: Four times a day (QID) | ORAL | Status: DC | PRN

## 2018-09-18 MED ORDER — CHLORHEXIDINE GLUCONATE CLOTH 2 % EX PADS: 6.0000 | MEDICATED_PAD | Freq: Once | CUTANEOUS | Status: DC

## 2018-09-18 MED ORDER — DEXAMETHASONE SODIUM PHOSPHATE 10 MG/ML IJ SOLN
10.0000 mg | INTRAMUSCULAR | Status: AC
  Administered 2018-09-18: 5 mg via INTRAVENOUS
  Filled 2018-09-18: qty 1

## 2018-09-18 MED ORDER — METHOCARBAMOL 1000 MG/10ML IJ SOLN
500.0000 mg | Freq: Four times a day (QID) | INTRAVENOUS | Status: DC | PRN
  Filled 2018-09-18: qty 5

## 2018-09-18 MED ORDER — ONDANSETRON HCL 4 MG/2ML IJ SOLN
INTRAMUSCULAR | Status: AC
  Filled 2018-09-18: qty 2

## 2018-09-18 MED ORDER — LIDOCAINE 2% (20 MG/ML) 5 ML SYRINGE
INTRAMUSCULAR | Status: DC | PRN
  Administered 2018-09-18: 100 mg via INTRAVENOUS

## 2018-09-18 MED ORDER — DEXAMETHASONE SODIUM PHOSPHATE 10 MG/ML IJ SOLN
INTRAMUSCULAR | Status: AC
  Filled 2018-09-18: qty 1

## 2018-09-18 MED ORDER — VANCOMYCIN HCL 1000 MG IV SOLR
INTRAVENOUS | Status: DC | PRN
  Administered 2018-09-18: 1000 mg

## 2018-09-18 MED ORDER — BUPIVACAINE HCL (PF) 0.25 % IJ SOLN
INTRAMUSCULAR | Status: AC
  Filled 2018-09-18: qty 30

## 2018-09-18 MED ORDER — SODIUM CHLORIDE 0.9 % IV SOLN
250.0000 mL | INTRAVENOUS | Status: DC
  Administered 2018-09-18: 250 mL via INTRAVENOUS

## 2018-09-18 MED ORDER — SODIUM CHLORIDE 0.9% FLUSH: 9.0000 mL | INTRAVENOUS | Status: DC | PRN

## 2018-09-18 MED ORDER — HEMOSTATIC AGENTS (NO CHARGE) OPTIME
TOPICAL | Status: DC | PRN
  Administered 2018-09-18: 1

## 2018-09-18 MED ORDER — HYDROMORPHONE HCL 1 MG/ML IJ SOLN
0.2500 mg | INTRAMUSCULAR | Status: AC | PRN
  Administered 2018-09-18 (×4): 0.5 mg via INTRAVENOUS

## 2018-09-18 MED ORDER — DEXAMETHASONE 4 MG PO TABS
4.0000 mg | ORAL_TABLET | Freq: Four times a day (QID) | ORAL | Status: DC
  Administered 2018-09-18 – 2018-09-20 (×5): 4 mg via ORAL
  Filled 2018-09-18 (×6): qty 1

## 2018-09-18 MED ORDER — CELECOXIB 200 MG PO CAPS
200.0000 mg | ORAL_CAPSULE | Freq: Two times a day (BID) | ORAL | Status: DC
  Administered 2018-09-18 – 2018-09-20 (×4): 200 mg via ORAL
  Filled 2018-09-18 (×4): qty 1

## 2018-09-18 MED ORDER — POTASSIUM CHLORIDE IN NACL 20-0.9 MEQ/L-% IV SOLN
INTRAVENOUS | Status: DC
  Administered 2018-09-18: 21:00:00 via INTRAVENOUS
  Filled 2018-09-18: qty 1000

## 2018-09-18 MED ORDER — SODIUM CHLORIDE 0.9% FLUSH
3.0000 mL | Freq: Two times a day (BID) | INTRAVENOUS | Status: DC
  Administered 2018-09-18 – 2018-09-20 (×4): 3 mL via INTRAVENOUS

## 2018-09-18 MED ORDER — VANCOMYCIN HCL 1000 MG IV SOLR
INTRAVENOUS | Status: AC
  Filled 2018-09-18: qty 1000

## 2018-09-18 MED ORDER — SENNA 8.6 MG PO TABS
1.0000 | ORAL_TABLET | Freq: Two times a day (BID) | ORAL | Status: DC
  Administered 2018-09-18 – 2018-09-20 (×4): 8.6 mg via ORAL
  Filled 2018-09-18 (×4): qty 1

## 2018-09-18 MED ORDER — SUGAMMADEX SODIUM 500 MG/5ML IV SOLN
INTRAVENOUS | Status: AC
  Filled 2018-09-18: qty 5

## 2018-09-18 MED ORDER — FENTANYL CITRATE (PF) 100 MCG/2ML IJ SOLN
INTRAMUSCULAR | Status: AC
  Administered 2018-09-18: 50 ug via INTRAVENOUS
  Filled 2018-09-18: qty 2

## 2018-09-18 MED ORDER — DEXAMETHASONE SODIUM PHOSPHATE 4 MG/ML IJ SOLN
4.0000 mg | Freq: Four times a day (QID) | INTRAMUSCULAR | Status: DC
  Administered 2018-09-19 – 2018-09-20 (×2): 4 mg via INTRAVENOUS
  Filled 2018-09-18 (×3): qty 1

## 2018-09-18 MED ORDER — ACETAMINOPHEN 325 MG PO TABS: 650.0000 mg | ORAL_TABLET | ORAL | Status: DC | PRN

## 2018-09-18 MED ORDER — DULOXETINE HCL 60 MG PO CPEP
60.0000 mg | ORAL_CAPSULE | Freq: Every day | ORAL | Status: DC
  Administered 2018-09-18 – 2018-09-20 (×3): 60 mg via ORAL
  Filled 2018-09-18 (×3): qty 1

## 2018-09-18 MED ORDER — HYDROMORPHONE 1 MG/ML IV SOLN
INTRAVENOUS | Status: DC
  Administered 2018-09-18: 23:00:00 via INTRAVENOUS
  Administered 2018-09-19: 3.6 mg via INTRAVENOUS
  Filled 2018-09-18 (×2): qty 25

## 2018-09-18 MED ORDER — LACTATED RINGERS IV SOLN
INTRAVENOUS | Status: DC
  Administered 2018-09-18 (×2): via INTRAVENOUS

## 2018-09-18 MED ORDER — THROMBIN 5000 UNITS EX SOLR
CUTANEOUS | Status: AC
  Filled 2018-09-18: qty 5000

## 2018-09-18 MED ORDER — ACETAMINOPHEN 500 MG PO TABS: 1000.0000 mg | ORAL_TABLET | Freq: Once | ORAL | Status: DC

## 2018-09-18 MED ORDER — DIPHENHYDRAMINE HCL 12.5 MG/5ML PO ELIX: 12.5000 mg | ORAL_SOLUTION | Freq: Four times a day (QID) | ORAL | Status: DC | PRN

## 2018-09-18 MED ORDER — HEPARIN SODIUM (PORCINE) 1000 UNIT/ML IJ SOLN
INTRAMUSCULAR | Status: DC | PRN
  Administered 2018-09-18: 5000 [IU]

## 2018-09-18 MED ORDER — DIPHENHYDRAMINE HCL 50 MG/ML IJ SOLN: 12.5000 mg | Freq: Four times a day (QID) | INTRAMUSCULAR | Status: DC | PRN

## 2018-09-18 MED ORDER — SODIUM CHLORIDE 0.9 % IV SOLN
INTRAVENOUS | Status: DC | PRN
  Administered 2018-09-18: 20 ug/min via INTRAVENOUS

## 2018-09-18 MED ORDER — SUGAMMADEX SODIUM 500 MG/5ML IV SOLN
INTRAVENOUS | Status: DC | PRN
  Administered 2018-09-18: 260 mg via INTRAVENOUS

## 2018-09-18 MED ORDER — ONDANSETRON HCL 4 MG/2ML IJ SOLN
INTRAMUSCULAR | Status: DC | PRN
  Administered 2018-09-18: 4 mg via INTRAVENOUS

## 2018-09-18 MED ORDER — THROMBIN 5000 UNITS EX SOLR
OROMUCOSAL | Status: DC | PRN
  Administered 2018-09-18: 5 mL via TOPICAL

## 2018-09-18 MED ORDER — PROPOFOL 10 MG/ML IV BOLUS
INTRAVENOUS | Status: AC
  Filled 2018-09-18: qty 20

## 2018-09-18 MED ORDER — FENTANYL CITRATE (PF) 100 MCG/2ML IJ SOLN
25.0000 ug | INTRAMUSCULAR | Status: DC | PRN
  Administered 2018-09-18 (×3): 50 ug via INTRAVENOUS

## 2018-09-18 MED ORDER — MORPHINE SULFATE (PF) 2 MG/ML IV SOLN
2.0000 mg | INTRAVENOUS | Status: DC | PRN
  Administered 2018-09-18 – 2018-09-19 (×2): 2 mg via INTRAVENOUS
  Filled 2018-09-18 (×3): qty 1

## 2018-09-18 MED ORDER — ROCURONIUM BROMIDE 50 MG/5ML IV SOSY
PREFILLED_SYRINGE | INTRAVENOUS | Status: AC
  Filled 2018-09-18: qty 10

## 2018-09-18 MED ORDER — PROPOFOL 10 MG/ML IV BOLUS
INTRAVENOUS | Status: DC | PRN
  Administered 2018-09-18: 50 mg via INTRAVENOUS
  Administered 2018-09-18: 150 mg via INTRAVENOUS
  Administered 2018-09-18: 20 mg via INTRAVENOUS
  Administered 2018-09-18: 100 mg via INTRAVENOUS

## 2018-09-18 MED ORDER — DIAZEPAM 5 MG/ML IJ SOLN
2.5000 mg | Freq: Once | INTRAMUSCULAR | Status: AC
  Administered 2018-09-18: 2.5 mg via INTRAVENOUS

## 2018-09-18 MED ORDER — THROMBIN 20000 UNITS EX SOLR
CUTANEOUS | Status: DC | PRN
  Administered 2018-09-18: 20 mL via TOPICAL

## 2018-09-18 MED ORDER — NALOXONE HCL 0.4 MG/ML IJ SOLN: 0.4000 mg | INTRAMUSCULAR | Status: DC | PRN

## 2018-09-18 MED ORDER — OXYCODONE HCL 5 MG PO TABS
5.0000 mg | ORAL_TABLET | ORAL | Status: DC | PRN
  Administered 2018-09-18 – 2018-09-20 (×6): 5 mg via ORAL
  Filled 2018-09-18 (×6): qty 1

## 2018-09-18 MED ORDER — ROCURONIUM BROMIDE 50 MG/5ML IV SOSY
PREFILLED_SYRINGE | INTRAVENOUS | Status: DC | PRN
  Administered 2018-09-18: 100 mg via INTRAVENOUS
  Administered 2018-09-18: 30 mg via INTRAVENOUS

## 2018-09-18 MED ORDER — ACETAMINOPHEN 650 MG RE SUPP: 650.0000 mg | RECTAL | Status: DC | PRN

## 2018-09-18 MED ORDER — GABAPENTIN 300 MG PO CAPS
300.0000 mg | ORAL_CAPSULE | Freq: Four times a day (QID) | ORAL | Status: DC
  Administered 2018-09-18 – 2018-09-20 (×5): 300 mg via ORAL
  Filled 2018-09-18 (×5): qty 1

## 2018-09-18 SURGICAL SUPPLY — 71 items
BAG DECANTER FOR FLEXI CONT (MISCELLANEOUS) ×2 IMPLANT
BASKET BONE COLLECTION (BASKET) ×2 IMPLANT
BENZOIN TINCTURE PRP APPL 2/3 (GAUZE/BANDAGES/DRESSINGS) ×2 IMPLANT
BLADE CLIPPER SURG (BLADE) IMPLANT
BUR MATCHSTICK NEURO 3.0 LAGG (BURR) ×2 IMPLANT
CAGE POROUS TI 12X9X25 10D (Cage) ×4 IMPLANT
CANISTER SUCT 3000ML PPV (MISCELLANEOUS) ×2 IMPLANT
CARTRIDGE OIL MAESTRO DRILL (MISCELLANEOUS) ×1 IMPLANT
CLSR STERI-STRIP ANTIMIC 1/2X4 (GAUZE/BANDAGES/DRESSINGS) ×2 IMPLANT
CONT SPEC 4OZ CLIKSEAL STRL BL (MISCELLANEOUS) ×2 IMPLANT
COVER BACK TABLE 60X90IN (DRAPES) ×2 IMPLANT
COVER WAND RF STERILE (DRAPES) ×2 IMPLANT
DERMABOND ADVANCED (GAUZE/BANDAGES/DRESSINGS) ×1
DERMABOND ADVANCED .7 DNX12 (GAUZE/BANDAGES/DRESSINGS) ×1 IMPLANT
DIFFUSER DRILL AIR PNEUMATIC (MISCELLANEOUS) ×2 IMPLANT
DRAPE C-ARM 42X72 X-RAY (DRAPES) ×2 IMPLANT
DRAPE C-ARMOR (DRAPES) ×2 IMPLANT
DRAPE LAPAROTOMY 100X72X124 (DRAPES) ×2 IMPLANT
DRAPE POUCH INSTRU U-SHP 10X18 (DRAPES) ×2 IMPLANT
DRAPE SURG 17X23 STRL (DRAPES) ×2 IMPLANT
DRSG OPSITE POSTOP 4X6 (GAUZE/BANDAGES/DRESSINGS) ×2 IMPLANT
DURAPREP 26ML APPLICATOR (WOUND CARE) ×2 IMPLANT
ELECT BLADE 4.0 EZ CLEAN MEGAD (MISCELLANEOUS) ×2
ELECT REM PT RETURN 9FT ADLT (ELECTROSURGICAL) ×2
ELECTRODE BLDE 4.0 EZ CLN MEGD (MISCELLANEOUS) ×1 IMPLANT
ELECTRODE REM PT RTRN 9FT ADLT (ELECTROSURGICAL) ×1 IMPLANT
EVACUATOR 1/8 PVC DRAIN (DRAIN) IMPLANT
GAUZE 4X4 16PLY RFD (DISPOSABLE) IMPLANT
GLOVE BIO SURGEON STRL SZ7 (GLOVE) ×2 IMPLANT
GLOVE BIO SURGEON STRL SZ8 (GLOVE) ×4 IMPLANT
GLOVE BIOGEL PI IND STRL 7.0 (GLOVE) ×2 IMPLANT
GLOVE BIOGEL PI IND STRL 7.5 (GLOVE) ×4 IMPLANT
GLOVE BIOGEL PI IND STRL 8 (GLOVE) ×1 IMPLANT
GLOVE BIOGEL PI INDICATOR 7.0 (GLOVE) ×2
GLOVE BIOGEL PI INDICATOR 7.5 (GLOVE) ×4
GLOVE BIOGEL PI INDICATOR 8 (GLOVE) ×1
GLOVE SURG SS PI 7.5 STRL IVOR (GLOVE) ×8 IMPLANT
GOWN STRL REUS W/ TWL LRG LVL3 (GOWN DISPOSABLE) ×3 IMPLANT
GOWN STRL REUS W/ TWL XL LVL3 (GOWN DISPOSABLE) ×1 IMPLANT
GOWN STRL REUS W/TWL 2XL LVL3 (GOWN DISPOSABLE) ×2 IMPLANT
GOWN STRL REUS W/TWL LRG LVL3 (GOWN DISPOSABLE) ×3
GOWN STRL REUS W/TWL XL LVL3 (GOWN DISPOSABLE) ×1
HEMOSTAT POWDER KIT SURGIFOAM (HEMOSTASIS) ×2 IMPLANT
KIT BASIN OR (CUSTOM PROCEDURE TRAY) ×2 IMPLANT
KIT BONE MRW ASP ANGEL CPRP (KITS) ×2 IMPLANT
KIT TURNOVER KIT B (KITS) ×2 IMPLANT
MILL MEDIUM DISP (BLADE) IMPLANT
NEEDLE HYPO 18GX1.5 BLUNT FILL (NEEDLE) ×4 IMPLANT
NEEDLE HYPO 25X1 1.5 SAFETY (NEEDLE) ×2 IMPLANT
NS IRRIG 1000ML POUR BTL (IV SOLUTION) ×2 IMPLANT
OIL CARTRIDGE MAESTRO DRILL (MISCELLANEOUS) ×2
PACK LAMINECTOMY NEURO (CUSTOM PROCEDURE TRAY) ×2 IMPLANT
PAD ARMBOARD 7.5X6 YLW CONV (MISCELLANEOUS) ×6 IMPLANT
PUTTY DBM ALLOSYNC PURE 10CC (Putty) ×2 IMPLANT
ROD LORDOTIC TI 5.5X40 KODIAK (Rod) ×4 IMPLANT
SCREW KODIAK 6.5X40 (Screw) ×4 IMPLANT
SCREW KODIAK 6.5X45 (Screw) ×4 IMPLANT
SET SCREW (Screw) ×4 IMPLANT
SET SCREW SPNE (Screw) ×4 IMPLANT
SPONGE LAP 4X18 RFD (DISPOSABLE) IMPLANT
SPONGE SURGIFOAM ABS GEL 100 (HEMOSTASIS) ×2 IMPLANT
STRIP CLOSURE SKIN 1/2X4 (GAUZE/BANDAGES/DRESSINGS) ×4 IMPLANT
SUT VIC AB 0 CT1 18XCR BRD8 (SUTURE) ×1 IMPLANT
SUT VIC AB 0 CT1 8-18 (SUTURE) ×1
SUT VIC AB 2-0 CP2 18 (SUTURE) ×2 IMPLANT
SUT VIC AB 3-0 SH 8-18 (SUTURE) ×4 IMPLANT
SYR CONTROL 10ML LL (SYRINGE) ×4 IMPLANT
TOWEL GREEN STERILE (TOWEL DISPOSABLE) ×2 IMPLANT
TOWEL GREEN STERILE FF (TOWEL DISPOSABLE) ×2 IMPLANT
TRAY FOLEY MTR SLVR 16FR STAT (SET/KITS/TRAYS/PACK) ×2 IMPLANT
WATER STERILE IRR 1000ML POUR (IV SOLUTION) ×2 IMPLANT

## 2018-09-18 NOTE — Progress Notes (Signed)
Patient became verbally and physically aggressive with previous RN in PACU, despite RN's attempts to treat pain. Dr. Yetta Barre aware and at bedside. Valium administered to patient per Dr. Yetta Barre request. Patient cooperative at this time.

## 2018-09-18 NOTE — H&P (Signed)
Subjective: Patient is a 43 y.o. male admitted for LPIF. Onset of symptoms was several months ago, gradually worsening since that time.  The pain is rated severe, unremitting, and is located at the across the lower back and radiates to legs. The pain is described as aching and occurs all day. The symptoms have been progressive. Symptoms are exacerbated by exercise. MRI or CT showed spondylolisthesis with stenosis l4-5   Past Medical History:  Diagnosis Date  . Arthritis    in back    History reviewed. No pertinent surgical history.  Prior to Admission medications   Medication Sig Start Date End Date Taking? Authorizing Provider  DULoxetine (CYMBALTA) 60 MG capsule Take 60 mg by mouth daily.   Yes [provider]  gabapentin (NEURONTIN) 300 MG capsule Take 300 mg by mouth 4 (four) times daily.   Yes [provider]  naproxen (NAPROSYN) 500 MG tablet Take 500 mg by mouth 2 (two) times daily as needed (pain.).   Yes [provider]  OVER THE COUNTER MEDICATION 1 Dose by Other route as needed. HEMP OIL & HEMP FLOWER AS NEEDED FOR PAIN.   Yes [provider]  traMADol (ULTRAM) 50 MG tablet Take 50 mg by mouth every 6 (six) hours as needed (pain.).   Yes [provider]   No Known Allergies  Social History   Tobacco Use  . Smoking status: Former Smoker    Last attempt to quit: 03/13/2017    Years since quitting: 1.5  . Smokeless tobacco: Never Used  Substance Use Topics  . Alcohol use: Not Currently    History reviewed. No pertinent family history.   Review of Systems  Positive ROS: neg  All other systems have been reviewed and were otherwise negative with the exception of those mentioned in the HPI and as above.  Objective: Vital signs in last 24 hours: Temp:  [98 F (36.7 C)] 98 F (36.7 C) (02/05 1007) Pulse Rate:  [85] 85 (02/05 1007) Resp:  [20] 20 (02/05 1007) BP: (145)/(92) 145/92 (02/05 1007) SpO2:  [100 %] 100 % (02/05  1007) Weight:  [127 kg] 127 kg (02/05 1007)  General Appearance: Alert, cooperative, no distress, appears stated age Head: Normocephalic, without obvious abnormality, atraumatic Eyes: PERRL, conjunctiva/corneas clear, EOM's intact    Neck: Supple, symmetrical, trachea midline Back: Symmetric, no curvature, ROM normal, no CVA tenderness Lungs:  respirations unlabored Heart: Regular rate and rhythm Abdomen: Soft, non-tender Extremities: Extremities normal, atraumatic, no cyanosis or edema Pulses: 2+ and symmetric all extremities Skin: Skin color, texture, turgor normal, no rashes or lesions  NEUROLOGIC:   Mental status: Alert and oriented x4,  no aphasia, good attention span, fund of knowledge, and memory Motor Exam - grossly normal Sensory Exam - grossly normal Reflexes: 1+ Coordination - grossly normal Gait - grossly normal Balance - grossly normal Cranial Nerves: I: smell Not tested  II: visual acuity  OS: nl    OD: nl  II: visual fields Full to confrontation  II: pupils Equal, round, reactive to light  III,VII: ptosis None  III,IV,VI: extraocular muscles  Full ROM  V: mastication Normal  V: facial light touch sensation  Normal  V,VII: corneal reflex  Present  VII: facial muscle function - upper  Normal  VII: facial muscle function - lower Normal  VIII: hearing Not tested  IX: soft palate elevation  Normal  IX,X: gag reflex Present  XI: trapezius strength  5/5  XI: sternocleidomastoid strength 5/5  XI: neck  flexion strength  5/5  XII: tongue strength  Normal    Data Review Lab Results  Component Value Date   WBC 7.0 09/11/2018   HGB 13.2 09/11/2018   HCT 41.5 09/11/2018   MCV 84.9 09/11/2018   PLT 333 09/11/2018   Lab Results  Component Value Date   NA 135 09/11/2018   K 3.8 09/11/2018   CL 103 09/11/2018   CO2 23 09/11/2018   BUN 11 09/11/2018   CREATININE 0.91 09/11/2018   GLUCOSE 127 (H) 09/11/2018   Lab Results  Component Value Date   INR 0.94  09/11/2018    Assessment/Plan:  Estimated body mass index is 39.05 kg/m as calculated from the following:   Height as of this encounter: 5\' 11"  (1.803 m).   Weight as of this encounter: 127 kg. Patient admitted for PLIF L4-5 in an obese pt. Patient has failed a reasonable attempt at conservative therapy.  I explained the condition and procedure to the patient and answered any questions.  Patient wishes to proceed with procedure as planned. Understands risks/ benefits and typical outcomes of procedure.   Tia Alert 09/18/2018 11:50 AM

## 2018-09-18 NOTE — Transfer of Care (Signed)
Immediate Anesthesia Transfer of Care Note  Patient: Rodney Gallagher  Procedure(s) Performed: LUMBAR FOUR-LUMBAR FIVE POSTERIOR LUMBAR INTERBODY FUSION (N/A Spine Lumbar)  Patient Location: PACU  Anesthesia Type:General  Level of Consciousness: awake, alert  and oriented  Airway & Oxygen Therapy: Patient Spontanous Breathing  Post-op Assessment: Report given to RN and Patient moving all extremities X 4  Post vital signs: Reviewed and stable  Last Vitals:  Vitals Value Taken Time  BP 132/74 09/18/2018  3:28 PM  Temp    Pulse 93 09/18/2018  3:30 PM  Resp 17 09/18/2018  3:30 PM  SpO2 100 % 09/18/2018  3:30 PM  Vitals shown include unvalidated device data.  Last Pain:  Vitals:   09/18/18 1011  TempSrc:   PainSc: 8          Complications: No apparent anesthesia complications

## 2018-09-18 NOTE — Progress Notes (Signed)
Orthopedic Tech Progress Note Patient Details:  Rodney Gallagher 1976/01/09 [id number] Called in order to [facility] Patients has rec the order. Patient ID: Rodney Gallagher, male   DOB: 15-Aug-1975, 43 y.o.   MRN: [id number]   Daphane Shepherd 09/18/2018, 3:37 PM

## 2018-09-18 NOTE — Anesthesia Preprocedure Evaluation (Addendum)
Anesthesia Evaluation  Patient identified by MRN, date of birth, ID band Patient awake    Reviewed: Allergy & Precautions, NPO status , Patient's Chart, lab work & pertinent test results  Airway Mallampati: III  TM Distance: >3 FB Neck ROM: Full    Dental no notable dental hx. (+) Teeth Intact, Dental Advisory Given   Pulmonary neg pulmonary ROS, former smoker,    Pulmonary exam normal breath sounds clear to auscultation       Cardiovascular negative cardio ROS Normal cardiovascular exam Rhythm:Regular Rate:Normal     Neuro/Psych negative neurological ROS  negative psych ROS   GI/Hepatic negative GI ROS, (+)     substance abuse  marijuana use,   Endo/Other  negative endocrine ROS  Renal/GU negative Renal ROS  negative genitourinary   Musculoskeletal  (+) Arthritis ,   Abdominal   Peds  Hematology negative hematology ROS (+)   Anesthesia Other Findings   Reproductive/Obstetrics negative OB ROS                            Anesthesia Physical Anesthesia Plan  ASA: II  Anesthesia Plan: General   Post-op Pain Management:    Induction: Intravenous  PONV Risk Score and Plan: 2 and Dexamethasone, Ondansetron and Midazolam  Airway Management Planned: Oral ETT  Additional Equipment:   Intra-op Plan:   Post-operative Plan: Extubation in OR  Informed Consent: I have reviewed the patients History and Physical, chart, labs and discussed the procedure including the risks, benefits and alternatives for the proposed anesthesia with the patient or authorized representative who has indicated his/her understanding and acceptance.     Dental advisory given  Plan Discussed with: CRNA  Anesthesia Plan Comments:         Anesthesia Quick Evaluation

## 2018-09-18 NOTE — Anesthesia Procedure Notes (Addendum)
Procedure Name: Intubation Date/Time: 09/18/2018 12:30 PM Performed by: Barrington Ellison, CRNA Pre-anesthesia Checklist: Patient identified, Emergency Drugs available, Suction available and Patient being monitored Patient Re-evaluated:Patient Re-evaluated prior to induction Oxygen Delivery Method: Circle System Utilized Preoxygenation: Pre-oxygenation with 100% oxygen Induction Type: IV induction Ventilation: Mask ventilation without difficulty and Oral airway inserted - appropriate to patient size Laryngoscope Size: Mac and 4 Grade View: Grade I Tube type: Oral Tube size: 7.5 mm Number of attempts: 1 Airway Equipment and Method: Stylet and Oral airway Placement Confirmation: ETT inserted through vocal cords under direct vision,  positive ETCO2 and breath sounds checked- equal and bilateral Secured at: 23 cm Tube secured with: Tape Dental Injury: Teeth and Oropharynx as per pre-operative assessment  Comments: Performed by Raymond Gurney, SRNA

## 2018-09-18 NOTE — Op Note (Signed)
09/18/2018  3:21 PM  PATIENT:  Rodney Gallagher  43 y.o. 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 Decompressive lumbar laminectomy L4-5 requiring more work than would be required for a simple exposure of the disk for PLIF in order to adequately decompress the neural elements and address the spinal stenosis 2. Posterior lumbar interbody fusion L4-5 using porous titanium interbody cages packed with morcellized allograft and autograft soaked with a bone marrow aspirate obtained through a separate fascial incision over the right iliac crest 3. Posterior fixation L4-5 using Alphatec cortical pedicle screws.  4. Intertransverse arthrodesis L4-5 using morcellized autograft and allograft.  SURGEON:  Marikay Alaravid Babacar Haycraft, MD  ASSISTANTS: Dr. Johnsie Cancelstergaard  ANESTHESIA:  General  EBL: 200 ml  Total I/O In: 1000 [I.V.:1000] Out: 280 [Urine:80; Blood:200]  BLOOD ADMINISTERED:none  DRAINS: none   INDICATION FOR PROCEDURE: This patient presented with a long history of back and leg pain. Imaging revealed degenerative spondylolisthesis L4-5 with spinal stenosis. The patient tried a reasonable attempt at conservative medical measures without relief. I recommended decompression and instrumented fusion to address the stenosis as well as the segmental  instability.  Patient understood the risks, benefits, and alternatives and potential outcomes and wished to proceed.  PROCEDURE DETAILS:  The patient was brought to the operating room. After induction of generalized endotracheal anesthesia the patient was rolled into the prone position on chest rolls and all pressure points were padded. The patient's lumbar region was cleaned and then prepped with DuraPrep and draped in the usual sterile fashion. Anesthesia was injected and then a dorsal midline incision was made and carried down to the  lumbosacral fascia. The fascia was opened and the paraspinous musculature was taken down in a subperiosteal fashion to expose L4 5. A self-retaining retractor was placed. Intraoperative fluoroscopy confirmed my level, and I started with placement of the L4 cortical pedicle screws. The pedicle screw entry zones were identified utilizing surface landmarks and  AP and lateral fluoroscopy. I scored the cortex with the high-speed drill and then used the hand drill to drill an upward and outward direction into the pedicle. I then tapped line to line. I then placed a 6.5 x 40 mm cortical pedicle screw into the pedicles of L4 bilaterally.  I then dissected in a suprafascial plane to expose the iliac crest.  Open the fascia used a Jamshidi needle to extract 60 cc of bone marrow aspirate from the iliac crest.  This was then spun down [facility] and 2 to 4 cc of  BMAC was soaked on morselized allograft for later arthrodesis.  I dried the hole with Surgifoam and closed the fascia.  I then turned my attention to the decompression and complete lumbar laminectomies, hemi- facetectomies, and foraminotomies were performed at L4-5. The patient had significant spinal stenosis and this required more work than would be required for a simple exposure of the disc for posterior lumbar interbody fusion which would only require a limited laminotomy. Much more generous decompression and generous foraminotomy was undertaken in order to adequately decompress the neural elements and address the patient's leg pain. The yellow ligament was removed to expose the underlying dura and nerve roots, and generous foraminotomies were performed to adequately decompress the neural elements. Both the exiting and traversing nerve roots were decompressed on both sides until a coronary dilator passed easily along the nerve roots. Once the decompression was complete,  I turned my attention to the posterior lower lumbar interbody fusion. The epidural venous  vasculature was coagulated and cut sharply. Disc space was incised and the initial discectomy was performed with pituitary rongeurs. The disc space was distracted with sequential distractors to a height of 12 mm. We then used a series of scrapers and shavers to prepare the endplates for fusion. The midline was prepared with Epstein curettes. Once the complete discectomy was finished, we packed an appropriate sized interbody cage with local autograft and morcellized allograft, gently retracted the nerve root, and tapped the cage into position at L4-5.  The midline between the cages was packed with morselized autograft and allograft. We then turned our attention to the placement of the lower pedicle screws. The pedicle screw entry zones were identified utilizing surface landmarks and fluoroscopy. I drilled into each pedicle utilizing the hand drill, and tapped each pedicle with the appropriate tap. We palpated with a ball probe to assure no break in the cortex. We then placed 6.5 x 40 mm pedicle screws into the pedicles bilaterally at L5. We then decorticated the transverse processes and laid a mixture of morcellized autograft and allograft out over these to perform intertransverse arthrodesis at L4-5. We then placed lordotic rods into the multiaxial screw heads of the pedicle screws and locked these in position with the locking caps and anti-torque device. We then checked our construct with AP and lateral fluoroscopy. Irrigated with copious amounts of bacitracin-containing saline solution. Inspected the nerve roots once again to assure adequate decompression, lined to the dura with Gelfoam, placed powdered vancomycin into the wound, and closed the muscle and the fascia with 0 Vicryl. Closed the subcutaneous tissues with 2-0 Vicryl and subcuticular tissues with 3-0 Vicryl. The skin was closed with benzoin and Steri-Strips. Dressing was then applied, the patient was awakened from general anesthesia and transported to  the recovery room in stable condition. At the end of the procedure all sponge, needle and instrument counts were correct.   PLAN OF CARE: admit to inpatient  PATIENT DISPOSITION:  PACU - hemodynamically stable.   Delay start of Pharmacological VTE agent (>24hrs) due to surgical blood loss or risk of bleeding:  yes

## 2018-09-19 MED ORDER — HYDROMORPHONE HCL 1 MG/ML IJ SOLN
0.5000 mg | INTRAMUSCULAR | Status: DC | PRN
  Administered 2018-09-19 – 2018-09-20 (×5): 0.5 mg via INTRAVENOUS
  Filled 2018-09-19 (×5): qty 1

## 2018-09-19 NOTE — Anesthesia Postprocedure Evaluation (Signed)
Anesthesia Post Note  Patient: Rodney Gallagher  Procedure(s) Performed: LUMBAR FOUR-LUMBAR FIVE POSTERIOR LUMBAR INTERBODY FUSION (N/A Spine Lumbar)     Patient location during evaluation: PACU Anesthesia Type: General Level of consciousness: awake and alert Pain management: pain level controlled Vital Signs Assessment: post-procedure vital signs reviewed and stable Respiratory status: spontaneous breathing, nonlabored ventilation, respiratory function stable and patient connected to nasal cannula oxygen Cardiovascular status: blood pressure returned to baseline and stable Postop Assessment: no apparent nausea or vomiting Anesthetic complications: no    Last Vitals:  Vitals:   09/19/18 0747 09/19/18 0840  BP:  (!) 150/97  Pulse:  100  Resp: (!) 24   Temp:  36.6 C  SpO2: 95% 100%    Last Pain:  Vitals:   09/19/18 0840  TempSrc: Oral  PainSc:                  Jaise Moser L Shade Rivenbark

## 2018-09-19 NOTE — Plan of Care (Signed)
  Problem: Safety: Goal: Ability to remain free from injury will improve Outcome: Progressing   Problem: Education: Goal: Knowledge of General Education information will improve Description Including pain rating scale, medication(s)/side effects and non-pharmacologic comfort measures Outcome: Progressing   Problem: Health Behavior/Discharge Planning: Goal: Ability to manage health-related needs will improve Outcome: Progressing   Problem: Clinical Measurements: Goal: Ability to maintain clinical measurements within normal limits will improve Outcome: Progressing Goal: Will remain free from infection Outcome: Progressing Goal: Diagnostic test results will improve Outcome: Progressing Goal: Respiratory complications will improve Outcome: Progressing Goal: Cardiovascular complication will be avoided Outcome: Progressing   Problem: Activity: Goal: Risk for activity intolerance will decrease Outcome: Progressing   Problem: Nutrition: Goal: Adequate nutrition will be maintained Outcome: Progressing   Problem: Elimination: Goal: Will not experience complications related to bowel motility Outcome: Progressing Goal: Will not experience complications related to urinary retention Outcome: Progressing   Problem: Pain Managment: Goal: General experience of comfort will improve Outcome: Progressing   Problem: Safety: Goal: Ability to remain free from injury will improve Outcome: Progressing   Problem: Skin Integrity: Goal: Risk for impaired skin integrity will decrease Outcome: Progressing

## 2018-09-19 NOTE — Evaluation (Signed)
Physical Therapy Evaluation Patient Details Name: Rodney Gallagher MRN: [id number] DOB: [birth year] Today's Date: 09/19/2018   History of Present Illness  Patient is a 43 y.o. male admitted for L4-5 PLIF, spinal stenosis, back and bilateral leg pain. PMH of back arthritis.    Clinical Impression  Pt was found sitting EOB upon arrival and reported pain in low back at incision. Pt was educated on back precautions, donning and doffing spinal brace and he verbalized understanding. Bed mobility staying within precautions needs to be assessed next visit. Pt ambulated [id number] with no increases in pain. Pt presents with pain and decreased activity tolerance limiting functional mobility. Pt would benefit from skilled physical therapy to address deficits in order to improve mobility and allow for safe return home.     Follow Up Recommendations No PT follow up    Equipment Recommendations  None recommended by PT    Recommendations for Other Services       Precautions / Restrictions Precautions Precautions: Back Precaution Booklet Issued: Yes (comment)(back handout provided) Precaution Comments: pt verbalized understanding  Required Braces or Orthoses: Spinal Brace Spinal Brace: Lumbar corset;Applied in sitting position Restrictions Weight Bearing Restrictions: No      Mobility  Bed Mobility               General bed mobility comments: pt sitting EOB on arrival  Transfers Overall transfer level: Needs assistance Equipment used: Rolling walker (2 wheeled) Transfers: Sit to/from Stand Sit to Stand: Min guard         General transfer comment: VC for hand placement   Ambulation/Gait Ambulation/Gait assistance: Min guard Gait Distance (Feet): 300 Feet Assistive device: Rolling walker (2 wheeled) Gait Pattern/deviations: Trunk flexed;Step-through pattern     General Gait Details: increased reliance on UE for support, VCs for posture, position of RW  Stairs             Wheelchair Mobility    Modified Rankin (Stroke Patients Only)       Balance Overall balance assessment: No apparent balance deficits (not formally assessed)                                           Pertinent Vitals/Pain Pain Assessment: 0-10 Pain Score: 7  Pain Descriptors / Indicators: Aching Pain Intervention(s): Monitored during session;Limited activity within patient's tolerance;Premedicated before session    Home Living Family/patient expects to be discharged to:: Private residence Living Arrangements: Spouse/significant other;Children Available Help at Discharge: Family Type of Home: House Home Access: Level entry(threshold to enter)     Home Layout: One level(finished basement but does not have to go to) Home Equipment: Environmental consultantWalker - 2 wheels Additional Comments: knee scooter    Prior Function Level of Independence: Independent               Hand Dominance        Extremity/Trunk Assessment   Upper Extremity Assessment Upper Extremity Assessment: Overall WFL for tasks assessed    Lower Extremity Assessment Lower Extremity Assessment: Overall WFL for tasks assessed    Cervical / Trunk Assessment Cervical / Trunk Assessment: Other exceptions Cervical / Trunk Exceptions: increased trunk flexion  Communication   Communication: No difficulties  Cognition Arousal/Alertness: Awake/alert Behavior During Therapy: WFL for tasks assessed/performed Overall Cognitive Status: Within Functional Limits for tasks assessed  General Comments      Exercises     Assessment/Plan    PT Assessment Patient needs continued PT services  PT Problem List Decreased activity tolerance;Decreased knowledge of precautions;Pain;Decreased mobility;Decreased knowledge of use of DME       PT Treatment Interventions DME instruction;Therapeutic exercise;Gait training;Functional mobility  training;Therapeutic activities;Patient/family education    PT Goals (Current goals can be found in the Care Plan section)  Acute Rehab PT Goals Patient Stated Goal: decrease pain and return home PT Goal Formulation: With patient Time For Goal Achievement: 09/26/18 Potential to Achieve Goals: Good    Frequency Min 5X/week   Barriers to discharge        Co-evaluation               AM-PAC PT "6 Clicks" Mobility  Outcome Measure Help needed turning from your back to your side while in a flat bed without using bedrails?: A Little Help needed moving from lying on your back to sitting on the side of a flat bed without using bedrails?: A Little Help needed moving to and from a bed to a chair (including a wheelchair)?: A Little Help needed standing up from a chair using your arms (e.g., wheelchair or bedside chair)?: A Little Help needed to walk in hospital room?: A Little Help needed climbing 3-5 steps with a railing? : A Little 6 Click Score: 18    End of Session Equipment Utilized During Treatment: Gait belt;Back brace;Oxygen Activity Tolerance: Patient tolerated treatment well Patient left: in chair;with call bell/phone within reach   PT Visit Diagnosis: Other abnormalities of gait and mobility (R26.89)    Time: [id number] PT Time Calculation (min) (ACUTE ONLY): 20 min   Charges:   PT Evaluation $PT Eval Moderate Complexity: 1 Mod            Ireoluwa Gorsline, SPT 09/19/2018, 9:57 AM [phone]

## 2018-09-19 NOTE — Progress Notes (Addendum)
RN on the phone with Dr. Yetta Barre to alert him of patient's IV infiltration. Per this conversation, RN given verbal orders as follow: D/C Dilaudid PCA, start Dilaudid 0.5mg  IV Q2hrs PRN pain 7-10 on a scale of 1-10. RN at patient's bedside to inform him of the updates. Patient agreeable to this plan and made aware of current pain regimen.

## 2018-09-19 NOTE — Progress Notes (Signed)
Wasted approximately 88mL dilaudid PCA 1mg /41mL with Ranelle, RN

## 2018-09-19 NOTE — Progress Notes (Signed)
Patient ID: Rodney Gallagher, male   DOB: 1976/02/19, 43 y.o.   MRN: [id number] Subjective: Patient reports back soreness.  No leg pain or numbness or tingling or weakness  Objective: Vital signs in last 24 hours: Temp:  [97.7 F (36.5 C)-98.6 F (37 C)] 98.2 F (36.8 C) (02/06 0340) Pulse Rate:  [85-133] 97 (02/06 0340) Resp:  [10-31] 24 (02/06 0747) BP: (132-176)/(68-107) 153/97 (02/06 0340) SpO2:  [95 %-100 %] 95 % (02/06 0747) Weight:  [865 kg] 127 kg (02/05 1007)  Intake/Output from previous day: 02/05 0701 - 02/06 0700 In: 2092.9 [I.V.:1900; IV Piggyback:192.9] Out: 1430 [Urine:1205; Blood:225] Intake/Output this shift: No intake/output data recorded.  Neurologic: Grossly normal  Lab Results: Lab Results  Component Value Date   WBC 7.0 09/11/2018   HGB 13.2 09/11/2018   HCT 41.5 09/11/2018   MCV 84.9 09/11/2018   PLT 333 09/11/2018   Lab Results  Component Value Date   INR 0.94 09/11/2018   BMET Lab Results  Component Value Date   NA 135 09/11/2018   K 3.8 09/11/2018   CL 103 09/11/2018   CO2 23 09/11/2018   GLUCOSE 127 (H) 09/11/2018   BUN 11 09/11/2018   CREATININE 0.91 09/11/2018   CALCIUM 8.7 (L) 09/11/2018    Studies/Results: Dg Lumbar Spine 2-3 Views  Result Date: 09/18/2018 CLINICAL DATA:  L4-5 posterior laminectomy and fusion. EXAM: DG C-ARM 61-120 MIN; LUMBAR SPINE - 2-3 VIEW COMPARISON:  Lumbar spine radiographs dated 02/05/2018. FINDINGS: PA and lateral C-arm views of the lower lumbar spine demonstrate interval interbody hardware and pedicle screw and rod fusion at the L4-5 level with normal alignment. IMPRESSION: Interbody and posterior hardware fusion at the L4-5 level with normal alignment. Electronically Signed   By: Beckie Salts M.D.   On: 09/18/2018 16:16   Dg C-arm 1-60 Min  Result Date: 09/18/2018 CLINICAL DATA:  L4-5 posterior laminectomy and fusion. EXAM: DG C-ARM 61-120 MIN; LUMBAR SPINE - 2-3 VIEW COMPARISON:  Lumbar spine radiographs  dated 02/05/2018. FINDINGS: PA and lateral C-arm views of the lower lumbar spine demonstrate interval interbody hardware and pedicle screw and rod fusion at the L4-5 level with normal alignment. IMPRESSION: Interbody and posterior hardware fusion at the L4-5 level with normal alignment. Electronically Signed   By: Beckie Salts M.D.   On: 09/18/2018 16:16    Assessment/Plan: Patient with postoperative back soreness.  Therapy today.  Continue pain management today.  Mobilize as tolerated  Estimated body mass index is 39.05 kg/m as calculated from the following:   Height as of this encounter: 5\' 11"  (1.803 m).   Weight as of this encounter: 127 kg.    LOS: 1 day    Tia Alert 09/19/2018, 7:58 AM

## 2018-09-19 NOTE — Progress Notes (Signed)
Occupational Therapy Evaluation Patient Details Name: Rodney Gallagher MRN: [id number] DOB: 06/07/1976 Today's Date: 09/19/2018    History of Present Illness Patient is a 43 y.o. male admitted for L4-5 PLIF, spinal stenosis, back and bilateral leg pain. PMH of back arthritis.     Clinical Impression   PTA pt independent in all ADLs and IADLs. Pt currently requires Max A for LB dressing, Bathing, and sink level ADLs with VC's for safety and adhering to back precautions. Began education on compensatory strategies and safety for back precautions with handout provided. Session limited by nursing providing IV. Pt will benefit from continued OT to address safety and maximizing independence in ADLs for a safe transition to home setting. DC recommend to home setting, OT will continue to follow acutely.     Follow Up Recommendations  No OT follow up    Equipment Recommendations  3 in 1 bedside commode    Recommendations for Other Services       Precautions / Restrictions Precautions Precautions: Back Precaution Booklet Issued: Yes (comment)(back handout provided) Precaution Comments: pt verbalized understanding  Required Braces or Orthoses: Spinal Brace Spinal Brace: Lumbar corset;Applied in sitting position Restrictions Weight Bearing Restrictions: No      Mobility Bed Mobility               General bed mobility comments: pt sitting in chair on arrival  Transfers             Balance Overall balance assessment: No apparent balance deficits (not formally assessed)                                         ADL either performed or assessed with clinical judgement   ADL Overall ADL's : Needs assistance/impaired             Lower Body Bathing: Maximal assistance;Cueing for safety;Cueing for sequencing;Cueing for back precautions       Lower Body Dressing: Maximal assistance;Cueing for safety;Cueing for sequencing;Cueing for back precautions    Toilet Transfer: Min guard;Cueing for safety;Cueing for sequencing;Ambulation   Toileting- Clothing Manipulation and Hygiene: Maximal assistance;Cueing for safety;Cueing for sequencing;Cueing for back precautions       Functional mobility during ADLs: Cueing for safety General ADL Comments: Pt educated on back precautions for ADLs engagment with major emphasis on "BLT" with handout provided.     Vision         Perception     Praxis      Pertinent Vitals/Pain Pain Assessment: 0-10 Pain Score: 7  Pain Descriptors / Indicators: Aching Pain Intervention(s): Monitored during session     Hand Dominance     Extremity/Trunk Assessment Upper Extremity Assessment Upper Extremity Assessment: Overall WFL for tasks assessed   Lower Extremity Assessment Lower Extremity Assessment: Defer to PT evaluation   Cervical / Trunk Assessment Cervical / Trunk Assessment: Other exceptions Cervical / Trunk Exceptions: increased trunk flexion   Communication Communication Communication: No difficulties   Cognition Arousal/Alertness: Awake/alert Behavior During Therapy: WFL for tasks assessed/performed Overall Cognitive Status: Within Functional Limits for tasks assessed                                     General Comments       Exercises     Shoulder Instructions  Home Living Family/patient expects to be discharged to:: Private residence Living Arrangements: Spouse/significant other;Children Available Help at Discharge: Family Type of Home: House Home Access: Level entry(threshold to enter)     Home Layout: One level(finished basement but does not have to go to)     Foot LockerBathroom Shower/Tub: Walk-in shower;Tub/shower unit   Bathroom Toilet: Standard     Home Equipment: Environmental consultantWalker - 2 wheels   Additional Comments: knee scooter      Prior Functioning/Environment Level of Independence: Independent                 OT Problem List: Pain;Decreased range  of motion;Decreased safety awareness;Decreased knowledge of use of DME or AE;Decreased knowledge of precautions      OT Treatment/Interventions: Self-care/ADL training;Therapeutic exercise;DME and/or AE instruction;Patient/family education;Balance training    OT Goals(Current goals can be found in the care plan section) Acute Rehab OT Goals Patient Stated Goal: decrease pain and return home OT Goal Formulation: With patient Time For Goal Achievement: 10/03/18 Potential to Achieve Goals: Good  OT Frequency: Min 2X/week   Barriers to D/C: Other (comment)  Pt reports wife being home to assist but also stating she works.       Co-evaluation              AM-PAC OT "6 Clicks" Daily Activity     Outcome Measure Help from another person eating meals?: None Help from another person taking care of personal grooming?: A Little Help from another person toileting, which includes using toliet, bedpan, or urinal?: A Little Help from another person bathing (including washing, rinsing, drying)?: A Lot Help from another person to put on and taking off regular upper body clothing?: A Little Help from another person to put on and taking off regular lower body clothing?: A Lot 6 Click Score: 17   End of Session Equipment Utilized During Treatment: Back brace Nurse Communication: Mobility status  Activity Tolerance: Other (comment)(Limited by application of IV from RN) Patient left: in chair;with call bell/phone within reach;with nursing/sitter in room  OT Visit Diagnosis: Pain Pain - part of body: (lower back)                Time: [id number] OT Time Calculation (min): 18 min Charges:  OT General Charges $OT Visit: 1 Visit OT Evaluation $OT Eval Low Complexity: 1 Low  Marquette OldEvan Harding Thomure, MSOT, OTR/L  [facility] Rehabilitation Services  [phone]  Zigmund Danielvan M Tymon Nemetz 09/19/2018, 12:37 PM

## 2018-09-20 MED ORDER — OXYCODONE HCL 5 MG PO TABS: 5.0000 mg | ORAL_TABLET | ORAL | 0 refills | Status: DC | PRN

## 2018-09-20 MED ORDER — METHOCARBAMOL 500 MG PO TABS: 500.0000 mg | ORAL_TABLET | Freq: Four times a day (QID) | ORAL | 1 refills | Status: DC | PRN

## 2018-09-20 NOTE — Discharge Summary (Signed)
Physician Discharge Summary  Patient ID: Rodney Gallagher MRN: [id number] DOB/AGE: [birth year] 42 y.o.  Admit date: 09/18/2018 Discharge date: 09/20/2018  Admission Diagnoses: spondylolisthesis  With stenosis   Discharge Diagnoses: same   Discharged Condition: good  Hospital Course: The patient was admitted on 09/18/2018 and taken to the operating room where the patient underwent PLIF L4-5. The patient tolerated the procedure well and was taken to the recovery room and then to the floor in stable condition. The hospital course was routine. There were no complications. The wound remained clean dry and intact. Pt had appropriate back soreness. No complaints of leg pain or new N/T/W. The patient remained afebrile with stable vital signs, and tolerated a regular diet. The patient continued to increase activities, and pain was well controlled with oral pain medications.   Consults: None  Significant Diagnostic Studies:  Results for orders placed or performed during the hospital encounter of 09/11/18  Surgical pcr screen  Result Value Ref Range   MRSA, PCR NEGATIVE NEGATIVE   Staphylococcus aureus NEGATIVE NEGATIVE  Basic metabolic panel  Result Value Ref Range   Sodium 135 135 - 145 mmol/L   Potassium 3.8 3.5 - 5.1 mmol/L   Chloride 103 98 - 111 mmol/L   CO2 23 22 - 32 mmol/L   Glucose, Bld 127 (H) 70 - 99 mg/dL   BUN 11 6 - 20 mg/dL   Creatinine, Ser 9.37 0.61 - 1.24 mg/dL   Calcium 8.7 (L) 8.9 - 10.3 mg/dL   GFR calc non Af Amer >60 >60 mL/min   GFR calc Af Amer >60 >60 mL/min   Anion gap 9 5 - 15  CBC WITH DIFFERENTIAL  Result Value Ref Range   WBC 7.0 4.0 - 10.5 K/uL   RBC 4.89 4.22 - 5.81 MIL/uL   Hemoglobin 13.2 13.0 - 17.0 g/dL   HCT 34.2 87.6 - 81.1 %   MCV 84.9 80.0 - 100.0 fL   MCH 27.0 26.0 - 34.0 pg   MCHC 31.8 30.0 - 36.0 g/dL   RDW 57.2 62.0 - 35.5 %   Platelets 333 150 - 400 K/uL   nRBC 0.0 0.0 - 0.2 %   Neutrophils Relative % 55 %   Neutro Abs 3.8 1.7 - 7.7  K/uL   Lymphocytes Relative 33 %   Lymphs Abs 2.3 0.7 - 4.0 K/uL   Monocytes Relative 8 %   Monocytes Absolute 0.6 0.1 - 1.0 K/uL   Eosinophils Relative 3 %   Eosinophils Absolute 0.2 0.0 - 0.5 K/uL   Basophils Relative 1 %   Basophils Absolute 0.0 0.0 - 0.1 K/uL   Immature Granulocytes 0 %   Abs Immature Granulocytes 0.02 0.00 - 0.07 K/uL  Protime-INR  Result Value Ref Range   Prothrombin Time 12.5 11.4 - 15.2 seconds   INR 0.94   Hepatic function panel  Result Value Ref Range   Total Protein 7.7 6.5 - 8.1 g/dL   Albumin 4.1 3.5 - 5.0 g/dL   AST 30 15 - 41 U/L   ALT 34 0 - 44 U/L   Alkaline Phosphatase 74 38 - 126 U/L   Total Bilirubin 0.6 0.3 - 1.2 mg/dL   Bilirubin, Direct 0.1 0.0 - 0.2 mg/dL   Indirect Bilirubin 0.5 0.3 - 0.9 mg/dL  Type and screen All Cardiac and thoracic surgeries, spinal fusions, myomectomies, craniotomies, colon & liver resections, total joint revisions, same day c-section with placenta previa or accreta.  Result Value Ref Range   ABO/RH(D)  A POS    Antibody Screen NEG    Sample Expiration 09/25/2018    Extend sample reason      NO TRANSFUSIONS OR PREGNANCY IN THE PAST 3 MONTHS Performed at [facility], [address], [place], [place] [address]   ABO/Rh  Result Value Ref Range   ABO/RH(D)      A POS Performed at [facility], [address], [place], [place] [address]     Chest 2 View  Result Date: 09/11/2018 CLINICAL DATA:  Preop for lung surgery EXAM: CHEST - 2 VIEW COMPARISON:  None. FINDINGS: The heart size and mediastinal contours are within normal limits. Both lungs are clear. The visualized skeletal structures are unremarkable. IMPRESSION: No active cardiopulmonary disease. Electronically Signed   By: Tollie Eth M.D.   On: 09/11/2018 14:03   Dg Lumbar Spine 2-3 Views  Result Date: 09/18/2018 CLINICAL DATA:  L4-5 posterior laminectomy and fusion. EXAM: DG C-ARM 61-120 MIN; LUMBAR SPINE - 2-3 VIEW COMPARISON:  Lumbar spine  radiographs dated 02/05/2018. FINDINGS: PA and lateral C-arm views of the lower lumbar spine demonstrate interval interbody hardware and pedicle screw and rod fusion at the L4-5 level with normal alignment. IMPRESSION: Interbody and posterior hardware fusion at the L4-5 level with normal alignment. Electronically Signed   By: Beckie Salts M.D.   On: 09/18/2018 16:16   Dg C-arm 1-60 Min  Result Date: 09/18/2018 CLINICAL DATA:  L4-5 posterior laminectomy and fusion. EXAM: DG C-ARM 61-120 MIN; LUMBAR SPINE - 2-3 VIEW COMPARISON:  Lumbar spine radiographs dated 02/05/2018. FINDINGS: PA and lateral C-arm views of the lower lumbar spine demonstrate interval interbody hardware and pedicle screw and rod fusion at the L4-5 level with normal alignment. IMPRESSION: Interbody and posterior hardware fusion at the L4-5 level with normal alignment. Electronically Signed   By: Beckie Salts M.D.   On: 09/18/2018 16:16    Antibiotics:  Anti-infectives (From admission, onward)   Start     Dose/Rate Route Frequency Ordered Stop   09/18/18 2000  ceFAZolin (ANCEF) IVPB 2g/100 mL premix     2 g 200 mL/hr over 30 Minutes Intravenous Every 8 hours 09/18/18 1932 09/19/18 0436   09/18/18 1454  vancomycin (VANCOCIN) powder  Status:  Discontinued       As needed 09/18/18 1454 09/18/18 1523   09/18/18 1325  bacitracin 50,000 Units in sodium chloride 0.9 % 500 mL irrigation  Status:  Discontinued       As needed 09/18/18 1327 09/18/18 1523   09/18/18 0600  ceFAZolin (ANCEF) 3 g in dextrose 5 % 50 mL IVPB     3 g 100 mL/hr over 30 Minutes Intravenous On call to O.R. 09/17/18 1233 09/18/18 1235      Discharge Exam: Blood pressure 123/82, pulse 100, temperature 97.7 F (36.5 C), temperature source Oral, resp. rate 16, height 5\' 11"  (1.803 m), weight 127 kg, SpO2 100 %. Neurologic: Grossly normal Dressing dry  Discharge Medications:   Allergies as of 09/20/2018   No Known Allergies     Medication List    STOP taking  these medications   naproxen 500 MG tablet Commonly known as:  NAPROSYN   traMADol 50 MG tablet Commonly known as:  ULTRAM     TAKE these medications   DULoxetine 60 MG capsule Commonly known as:  CYMBALTA Take 60 mg by mouth daily.   gabapentin 300 MG capsule Commonly known as:  NEURONTIN Take 300 mg by mouth 4 (four)  times daily.   methocarbamol 500 MG tablet Commonly known as:  ROBAXIN Take 1 tablet (500 mg total) by mouth every 6 (six) hours as needed for muscle spasms.   OVER THE COUNTER MEDICATION 1 Dose by Other route as needed. HEMP OIL & HEMP FLOWER AS NEEDED FOR PAIN.   oxyCODONE 5 MG immediate release tablet Commonly known as:  Oxy IR/ROXICODONE Take 1 tablet (5 mg total) by mouth every 4 (four) hours as needed for moderate pain ((score 4 to 6)).            Durable Medical Equipment  (From admission, onward)         Start     Ordered   09/18/18 1933  DME Walker rolling  Once    Question:  Patient needs a walker to treat with the following condition  Answer:  S/P lumbar fusion   09/18/18 1932   09/18/18 1933  DME 3 n 1  Once     09/18/18 1932          Disposition: home   Final Dx: PLIF L4-5  Discharge Instructions     Remove dressing in 72 hours   Complete by:  As directed    Call MD for:  difficulty breathing, headache or visual disturbances   Complete by:  As directed    Call MD for:  persistant nausea and vomiting   Complete by:  As directed    Call MD for:  redness, tenderness, or signs of infection (pain, swelling, redness, odor or green/yellow discharge around incision site)   Complete by:  As directed    Call MD for:  severe uncontrolled pain   Complete by:  As directed    Call MD for:  temperature >100.4   Complete by:  As directed    Diet - low sodium heart healthy   Complete by:  As directed    Increase activity slowly   Complete by:  As directed       Follow-up Information    Tia AlertJones, David S, MD. Schedule an appointment as  soon as possible for a visit in 2 week(s).   Specialty:  Neurosurgery Contact information: [address] [place] [address] [phone]            Signed: Tia AlertDavid S Jones 09/20/2018, 7:40 AM

## 2018-09-20 NOTE — Progress Notes (Addendum)
Occupational Therapy Treatment Patient Details Name: Rodney Gallagher MRN: [id number] DOB: 02-May-1976 Today's Date: 09/20/2018    History of present illness Patient is a 43 y.o. male admitted for L4-5 PLIF, spinal stenosis, back and bilateral leg pain. PMH of back arthritis.     OT comments  Pt with good progress towards OT goals. Focus of session on education in use of AE during LB ADL tasks. Pt verbalizing and return demonstrating good understanding throughout. Issued appropriate AE during this session. Further reviewed back precautions, safety and compensatory strategies for performing ADL and functional transfers while maintaining precautions; pt demonstrating mobility with and without RW today at distant supervision level. He is hopeful for d/c home later today. Will continue per POC.    Follow Up Recommendations  No OT follow up    Equipment Recommendations  3 in 1 bedside commode          Precautions / Restrictions Precautions Precautions: Back Precaution Comments: pt able to state all precautions  Required Braces or Orthoses: Spinal Brace Spinal Brace: Lumbar corset;Applied in sitting position Restrictions Weight Bearing Restrictions: No       Mobility Bed Mobility Overal bed mobility: Independent Bed Mobility: Rolling;Sidelying to Sit;Sit to Sidelying Rolling: Independent Sidelying to sit: Independent     Sit to sidelying: Independent General bed mobility comments: Pt OOB upon arrival  Transfers Overall transfer level: Modified independent               General transfer comment: performing sit<>stand from recliner during session    Balance Overall balance assessment: No apparent balance deficits (not formally assessed)                                         ADL either performed or assessed with clinical judgement   ADL Overall ADL's : Needs assistance/impaired                     Lower Body Dressing: Minimal  assistance;Min guard;Sit to/from stand;With adaptive equipment Lower Body Dressing Details (indicate cue type and reason): educated in use of sock aid and reacher for performing LB ADL with pt return dmeonstrating; provided additional instruction in use of other AE for LB ADL including long handled shoe horn, LH sponge     Toileting- Clothing Manipulation and Hygiene: Supervision/safety;Sit to/from stand Toileting - Clothing Manipulation Details (indicate cue type and reason): pt completing toileting and exiting bathroom upon therapist arrival    Tub/Shower Transfer Details (indicate cue type and reason): educated on using walk-in shower initially for increased safety and adhering to back precautions with transfer (vs using tub); also educated to use 3:1 as shower chair initially for increased safety/independence with pt verbalizing understanding Functional mobility during ADLs: Supervision/safety;Rolling walker(with and without RW) General ADL Comments: reviewed back precautions, brace management, AE, safety and compensatory strategy for performing ADL and functional transfer while maintaining precautions, pt verbalizing understanding throughout, moving well today; issued appropriate AE during session                       Cognition Arousal/Alertness: Awake/alert Behavior During Therapy: [facility] for tasks assessed/performed Overall Cognitive Status: Within Functional Limits for tasks assessed  Exercises     Shoulder Instructions       General Comments      Pertinent Vitals/ Pain       Pain Assessment: Faces Pain Score: 7  Faces Pain Scale: Hurts a little bit Pain Location: back Pain Descriptors / Indicators: Aching Pain Intervention(s): Monitored during session;Limited activity within patient's tolerance  Home Living                                          Prior Functioning/Environment               Frequency  Min 2X/week        Progress Toward Goals  OT Goals(current goals can now be found in the care plan section)  Progress towards OT goals: Progressing toward goals  Acute Rehab OT Goals Patient Stated Goal: home today OT Goal Formulation: With patient Time For Goal Achievement: 10/03/18 Potential to Achieve Goals: Good ADL Goals Pt Will Perform Lower Body Bathing: with modified independence;with adaptive equipment;sit to/from stand Pt Will Perform Lower Body Dressing: with modified independence;with adaptive equipment;sit to/from stand Pt Will Transfer to Toilet: with modified independence;ambulating;bedside commode Pt Will Perform Toileting - Clothing Manipulation and hygiene: with modified independence;with adaptive equipment;sit to/from stand Pt Will Perform Tub/Shower Transfer: Tub transfer;3 in 1;ambulating  Plan Discharge plan remains appropriate    Co-evaluation                 AM-PAC OT "6 Clicks" Daily Activity     Outcome Measure   Help from another person eating meals?: None Help from another person taking care of personal grooming?: None Help from another person toileting, which includes using toliet, bedpan, or urinal?: None Help from another person bathing (including washing, rinsing, drying)?: A Little Help from another person to put on and taking off regular upper body clothing?: None Help from another person to put on and taking off regular lower body clothing?: A Little 6 Click Score: 22    End of Session Equipment Utilized During Treatment: Back brace;Other (comment)(AE)  OT Visit Diagnosis: Pain Pain - part of body: (back)   Activity Tolerance Patient tolerated treatment well   Patient Left in chair;with call bell/phone within reach   Nurse Communication Mobility status        Time: 0934-1000 OT Time Calculation (min): 26 min  Charges: OT General Charges $OT Visit: 1 Visit OT Treatments $Self Care/Home Management :  23-37 mins  Marcy Siren, OT Supplemental Rehabilitation Services Pager [phone] Office [phone]    Orlando Penner 09/20/2018, 10:36 AM

## 2018-09-20 NOTE — Plan of Care (Signed)
  Problem: Safety: Goal: Ability to remain free from injury will improve Outcome: Progressing   Problem: Education: Goal: Knowledge of General Education information will improve Description: Including pain rating scale, medication(s)/side effects and non-pharmacologic comfort measures Outcome: Progressing   Problem: Health Behavior/Discharge Planning: Goal: Ability to manage health-related needs will improve Outcome: Progressing   Problem: Clinical Measurements: Goal: Ability to maintain clinical measurements within normal limits will improve Outcome: Progressing Goal: Will remain free from infection Outcome: Progressing Goal: Diagnostic test results will improve Outcome: Progressing Goal: Respiratory complications will improve Outcome: Progressing Goal: Cardiovascular complication will be avoided Outcome: Progressing   Problem: Activity: Goal: Risk for activity intolerance will decrease Outcome: Progressing   Problem: Nutrition: Goal: Adequate nutrition will be maintained Outcome: Progressing   Problem: Coping: Goal: Level of anxiety will decrease Outcome: Progressing   Problem: Elimination: Goal: Will not experience complications related to bowel motility Outcome: Progressing Goal: Will not experience complications related to urinary retention Outcome: Progressing   Problem: Pain Managment: Goal: General experience of comfort will improve Outcome: Progressing   Problem: Safety: Goal: Ability to remain free from injury will improve Outcome: Progressing   Problem: Skin Integrity: Goal: Risk for impaired skin integrity will decrease Outcome: Progressing   

## 2018-09-20 NOTE — Progress Notes (Signed)
Physical Therapy Treatment Patient Details Name: Rodney Gallagher MRN: [id number] DOB: [birth year] Today's Date: 09/20/2018    History of Present Illness Patient is a 43 y.o. male admitted for L4-5 PLIF, spinal stenosis, back and bilateral leg pain. PMH of back arthritis.      PT Comments    Pt pleasant sitting EOB without brace on on arrival. Pt able to perform bed mobility without assist or cues, don brace in sitting independently and progress distance without RW today. Pt able to state all precautionas and how to implement with daily activity. Pt moving well and safe for return home.     Follow Up Recommendations  No PT follow up     Equipment Recommendations  None recommended by PT    Recommendations for Other Services       Precautions / Restrictions Precautions Precautions: Back Precaution Comments: pt able to state all precautions and demonstrated with mobility Required Braces or Orthoses: Spinal Brace Spinal Brace: Lumbar corset;Applied in sitting position    Mobility  Bed Mobility Overal bed mobility: Independent Bed Mobility: Rolling;Sidelying to Sit;Sit to Sidelying Rolling: Independent Sidelying to sit: Independent     Sit to sidelying: Independent General bed mobility comments: pt sitting EOB and able to perform all bed mobility without cues, bed flat and no rails appropriately  Transfers Overall transfer level: Modified independent               General transfer comment: sit to stand from bed and to chair  Ambulation/Gait Ambulation/Gait assistance: Modified independent (Device/Increase time) Gait Distance (Feet): 300 Feet Assistive device: Rolling walker (2 wheeled);None Gait Pattern/deviations: Step-through pattern;Decreased stride length   Gait velocity interpretation: >2.62 ft/sec, indicative of community ambulatory General Gait Details: pt walked grossly 9' without RW with good posture and gait but requested RW for increased distance with  pt able to maintain appropriate posture and position in RW this session with encouragement to increase distance without RW   Stairs             Wheelchair Mobility    Modified Rankin (Stroke Patients Only)       Balance                                            Cognition Arousal/Alertness: Awake/alert Behavior During Therapy: WFL for tasks assessed/performed Overall Cognitive Status: Within Functional Limits for tasks assessed                                        Exercises      General Comments        Pertinent Vitals/Pain Pain Score: 7  Pain Location: back Pain Descriptors / Indicators: Aching Pain Intervention(s): Limited activity within patient's tolerance;Premedicated before session;Repositioned;Monitored during session    Home Living                      Prior Function            PT Goals (current goals can now be found in the care plan section) Progress towards PT goals: Progressing toward goals    Frequency           PT Plan Current plan remains appropriate    Co-evaluation  AM-PAC PT "6 Clicks" Mobility   Outcome Measure  Help needed turning from your back to your side while in a flat bed without using bedrails?: None Help needed moving from lying on your back to sitting on the side of a flat bed without using bedrails?: None Help needed moving to and from a bed to a chair (including a wheelchair)?: None Help needed standing up from a chair using your arms (e.g., wheelchair or bedside chair)?: None Help needed to walk in hospital room?: A Little Help needed climbing 3-5 steps with a railing? : A Little 6 Click Score: 22    End of Session Equipment Utilized During Treatment: Gait belt;Back brace Activity Tolerance: Patient tolerated treatment well Patient left: in chair;with call bell/phone within reach Nurse Communication: Mobility status PT Visit Diagnosis: Other  abnormalities of gait and mobility (R26.89)     Time: 7846-9629 PT Time Calculation (min) (ACUTE ONLY): 10 min  Charges:  $Gait Training: 8-22 mins                     Rodney Gallagher Rodney Gallagher, PT Acute Rehabilitation Services Pager: [phone] Office: [phone]    Rodney Gallagher 09/20/2018, 8:38 AM

## 2019-10-30 ENCOUNTER — Other Ambulatory Visit (HOSPITAL_COMMUNITY): Payer: Self-pay | Admitting: Neurological Surgery

## 2019-10-30 ENCOUNTER — Other Ambulatory Visit: Payer: Self-pay | Admitting: Neurological Surgery

## 2019-10-30 DIAGNOSIS — M545 Low back pain, unspecified: Secondary | ICD-10-CM

## 2019-10-31 ENCOUNTER — Encounter (HOSPITAL_COMMUNITY): Payer: Self-pay | Admitting: Emergency Medicine

## 2019-11-11 ENCOUNTER — Ambulatory Visit (HOSPITAL_COMMUNITY)
Admission: RE | Admit: 2019-11-11 | Discharge: 2019-11-11 | Disposition: A | Discharge status: Home / Self Care | Payer: Self-pay | Source: Ambulatory Visit | Attending: Neurological Surgery | Admitting: Neurological Surgery

## 2019-11-11 ENCOUNTER — Other Ambulatory Visit: Payer: Self-pay

## 2019-11-11 DIAGNOSIS — M545 Low back pain, unspecified: Secondary | ICD-10-CM

## 2019-11-27 ENCOUNTER — Other Ambulatory Visit (HOSPITAL_COMMUNITY): Payer: Self-pay | Admitting: Neurological Surgery

## 2019-11-27 ENCOUNTER — Other Ambulatory Visit: Payer: Self-pay | Admitting: Neurological Surgery

## 2019-11-27 DIAGNOSIS — M545 Low back pain, unspecified: Secondary | ICD-10-CM

## 2019-12-05 ENCOUNTER — Ambulatory Visit (HOSPITAL_COMMUNITY)
Admission: RE | Admit: 2019-12-05 | Discharge: 2019-12-05 | Disposition: A | Discharge status: Home / Self Care | Payer: Self-pay | Source: Ambulatory Visit | Attending: Neurological Surgery | Admitting: Neurological Surgery

## 2019-12-05 ENCOUNTER — Other Ambulatory Visit: Payer: Self-pay

## 2019-12-05 DIAGNOSIS — M545 Low back pain, unspecified: Secondary | ICD-10-CM

## 2020-02-24 ENCOUNTER — Telehealth: Payer: Self-pay | Admitting: Physical Medicine and Rehabilitation

## 2020-02-24 NOTE — Telephone Encounter (Signed)
Patient request a call back with the status of his referral for an injection.

## 2020-02-25 NOTE — Telephone Encounter (Signed)
Reviewed by Dr. Alvester Morin. Scheduled for OV.

## 2020-03-04 IMAGING — CR DG CHEST 2V
2 series · 2 of 2 positions shown · non-contrast
Comparison: None.

CLINICAL DATA: Preop for lung surgery

EXAM:
CHEST - 2 VIEW

[w chest pa]
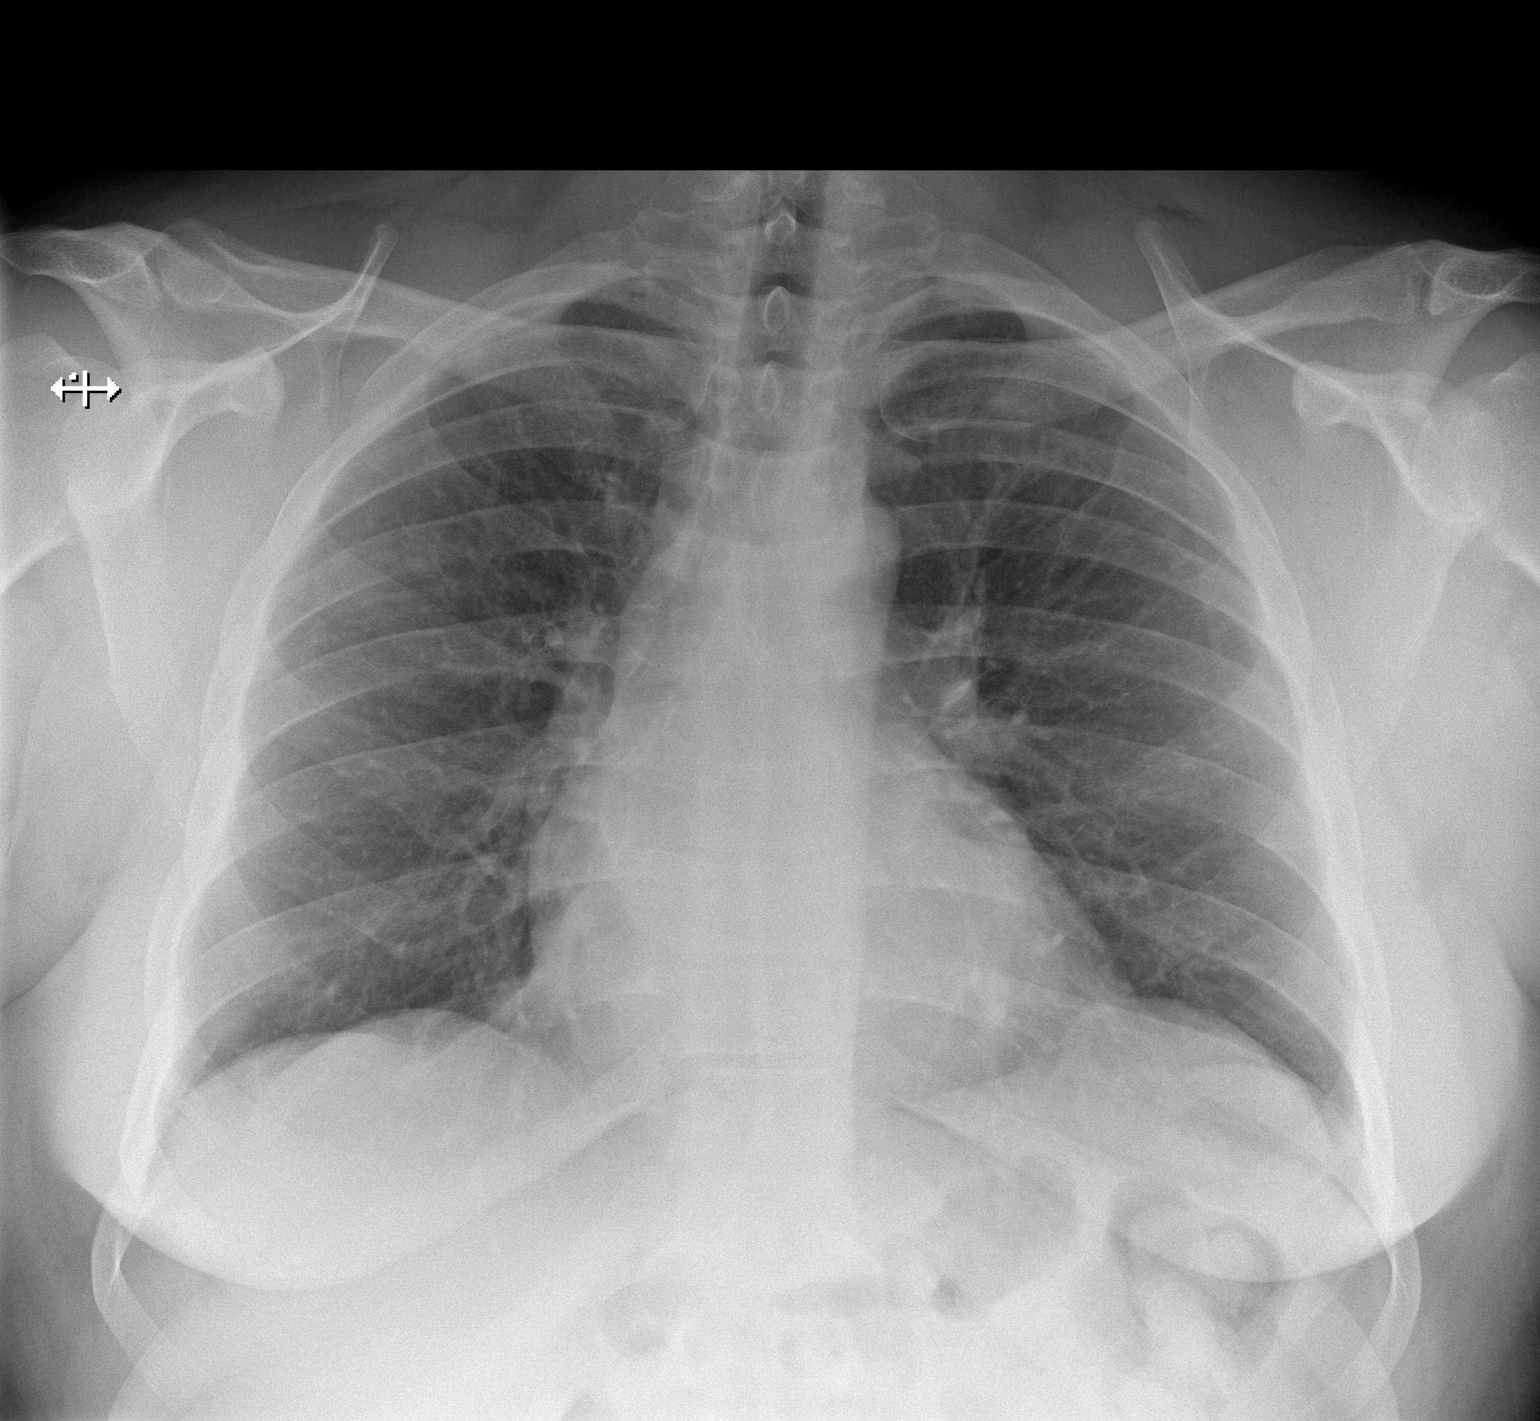

[w chest lat]
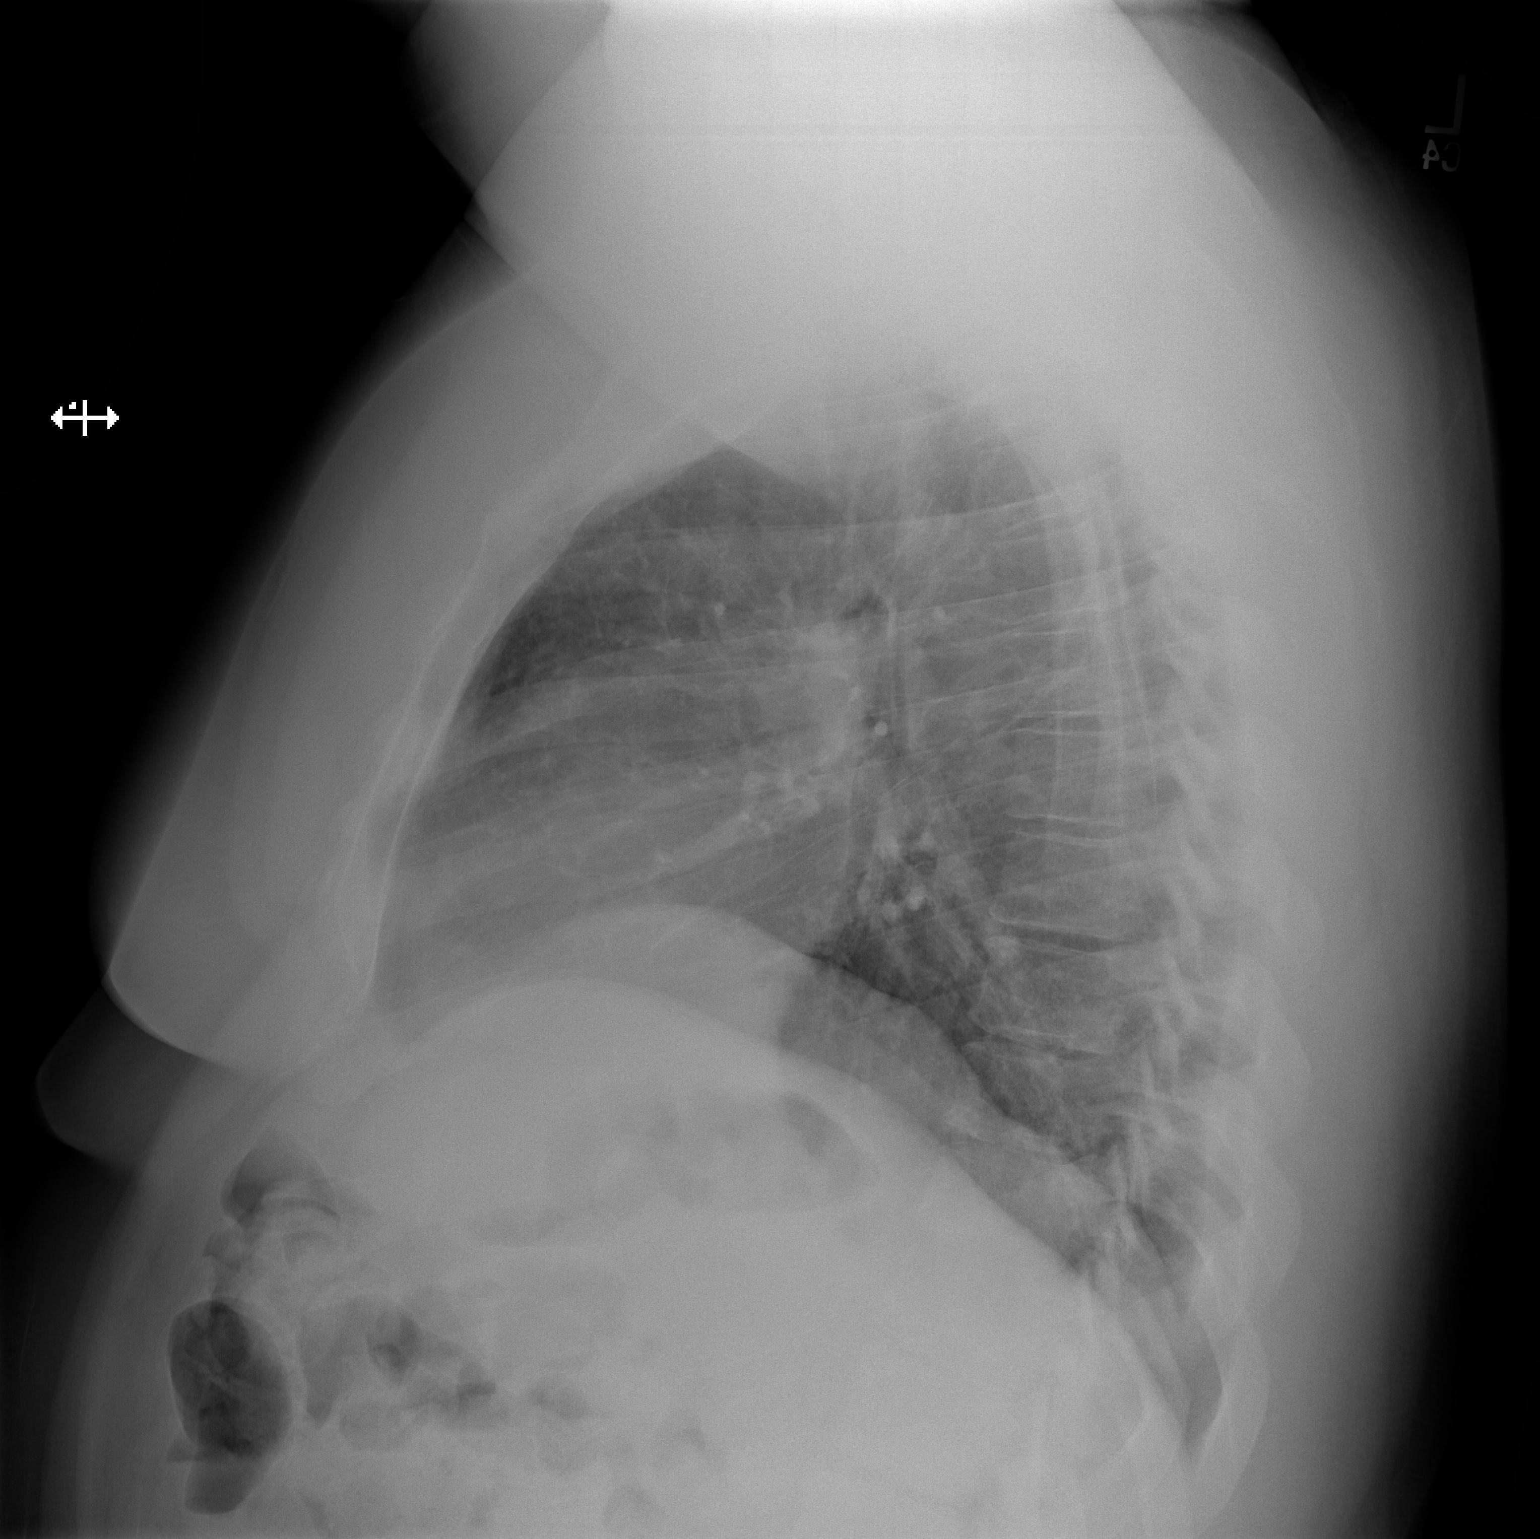

[2 of 2 positions shown; findings below may reference images not displayed]

FINDINGS: The heart size and mediastinal contours are within normal limits.
Both lungs are clear. The visualized skeletal structures are
unremarkable.
IMPRESSION: No active cardiopulmonary disease.

## 2020-03-09 ENCOUNTER — Other Ambulatory Visit: Payer: Self-pay

## 2020-03-09 ENCOUNTER — Ambulatory Visit (INDEPENDENT_AMBULATORY_CARE_PROVIDER_SITE_OTHER): Payer: Self-pay | Admitting: Physical Medicine and Rehabilitation

## 2020-03-09 ENCOUNTER — Encounter: Payer: Self-pay | Admitting: Physical Medicine and Rehabilitation

## 2020-03-09 VITALS — BP 145/96 | HR 83

## 2020-03-09 DIAGNOSIS — M7918 Myalgia, other site: Secondary | ICD-10-CM

## 2020-03-09 DIAGNOSIS — G8929 Other chronic pain: Secondary | ICD-10-CM

## 2020-03-09 DIAGNOSIS — M545 Low back pain: Secondary | ICD-10-CM

## 2020-03-09 DIAGNOSIS — M961 Postlaminectomy syndrome, not elsewhere classified: Secondary | ICD-10-CM

## 2020-03-09 NOTE — Progress Notes (Signed)
Lower back pain. No pain in legs "as long as he takes his medicine." Stopped taking gabapentin. Pain has been present for 3 years and seven months. Injured on the job. Has had injections in the past that did not help. Numeric Pain Rating Scale and Functional Assessment Average Pain 6 Pain Right Now 6 My pain is constant and sharp Pain is worse with: some activites Pain improves with: rest and lying down   In the last MONTH (on 0-10 scale) has pain interfered with the following?  1. General activity like being  able to carry out your everyday physical activities such as walking, climbing stairs, carrying groceries, or moving a chair?  Rating(8)  2. Relation with others like being able to carry out your usual social activities and roles such as  activities at home, at work and in your community. Rating(8)  3. Enjoyment of life such that you have  been bothered by emotional problems such as feeling anxious, depressed or irritable?  Rating(7)

## 2020-03-11 ENCOUNTER — Ambulatory Visit: Payer: Medicaid Other | Attending: Physical Medicine and Rehabilitation | Admitting: Physical Therapy

## 2020-03-11 ENCOUNTER — Other Ambulatory Visit: Payer: Self-pay

## 2020-03-11 ENCOUNTER — Encounter: Payer: Self-pay | Admitting: Physical Therapy

## 2020-03-11 DIAGNOSIS — M545 Low back pain, unspecified: Secondary | ICD-10-CM

## 2020-03-11 DIAGNOSIS — M6281 Muscle weakness (generalized): Secondary | ICD-10-CM | POA: Insufficient documentation

## 2020-03-11 DIAGNOSIS — G8929 Other chronic pain: Secondary | ICD-10-CM | POA: Insufficient documentation

## 2020-03-11 DIAGNOSIS — R293 Abnormal posture: Secondary | ICD-10-CM

## 2020-03-11 NOTE — Therapy (Signed)
[facility] [address] [place], [place], [address] Phone: [phone]   Fax:  [phone]  Physical Therapy Evaluation  Patient Details  Name: Rodney Gallagher MRN: [id number] Date of Birth: 28-Oct-1975 Referring Provider (PT): Tyrell Antonio, MD    Encounter Date: 03/11/2020   PT End of Session - 03/11/20 1542    Visit Number 1    Number of Visits 13    Date for PT Re-Evaluation 04/22/20    Authorization Type PT reports having CAFA    PT Start Time 1544    PT Stop Time 1628    PT Time Calculation (min) 44 min    Activity Tolerance Patient tolerated treatment well    Behavior During Therapy [facility] for tasks assessed/performed           Past Medical History:  Diagnosis Date  . Arthritis    in back  . Herniation of lumbar intervertebral disc   . Hypertension     Past Surgical History:  Procedure Laterality Date  . LUMBAR FUSION  09/18/2018   combined with laminectomy of L4-L5    There were no vitals filed for this visit.    Subjective Assessment - 03/11/20 1549    Subjective pt is 44 y.o M with CC of low back pain that that started after 2 months after his L4-L5 fusion/ laminectomy on 09/18/2018 with no specific. MOI. Since onset pt reports the symptom continues to gradually get worse. He reports the pain goes down bil bil LE fromt he hips down but is controlled with medication. He denies any red flags    Pertinent History L4-L5 fusion/ laminectomy on 09/18/2018    Limitations Lifting;Walking;Standing    How long can you sit comfortably? 1 hours    How long can you stand comfortably? 20 min    How long can you walk comfortably? 5-7 min    Diagnostic tests 4/23/2020IMPRESSION:1. Prior posterior spinal fusion and decompression at L4-5 withinterbody spacer placement. Fusion hardware is intact withoutfeatures of hardware loosening.2. Slight subsidence of the L4 inferior endplate with minimal changeof the superior endplate  L5 as well.3. No significant canal stenosis or residual neural foraminalnarrowing at the operative level.4. Moderate bilateral neural foraminal narrowing and narrowing ofthe left lateral recess at L3-L4. Mild bilateral foraminal narrowingL5-S1 as well.5. Mild bilateral SI arthrosis, left greater than right.    Patient Stated Goals reduce pain as much as possible, avoid surgery as much as possible.    Currently in Pain? Yes    Pain Score 6    last took meds for pain at 1:30   Pain Location Back    Pain Orientation Right;Left;Lower    Pain Descriptors / Indicators Aching;Stabbing;Sharp    Pain Type Chronic pain    Pain Radiating Towards without medicaiton down bil LE into the feet    Pain Onset More than a month ago    Pain Frequency Constant    Aggravating Factors  getting out of bed in the morning, walking/ standing, lifting, constant motion    Pain Relieving Factors pain medication, losing weight, intermittent breaks, laying down    Effect of Pain on Daily Activities limited endurance,              [facility] PT Assessment - 03/11/20 0001      Assessment   Medical Diagnosis Chronic midline low back pain without sciatica M54.5, G89.29, Post laminectomy syndrome M96.1, Myofascial pain M79.18    Referring Provider (PT) Tyrell Antonio, MD  Onset Date/Surgical Date 09/18/18   L4-L5 laminectomy and fusion   Hand Dominance Right    Next MD Visit make one PRN     Prior Therapy yes      Precautions   Precautions None    Precaution Comments no bending, lifting, Twistin       Restrictions   Weight Bearing Restrictions No      Balance Screen   Has the patient fallen in the past 6 months No      Home Environment   Living Environment Private residence    Living Arrangements Other relatives    Available Help at Discharge Family    Type of Home House    Home Access Level entry    Home Layout Two level    Alternate Level Stairs-Number of Steps 15    Alternate Level Stairs-Rails Right    ascending   Press photographer - 2 wheels;Cane - quad;Toilet riser;Grab bars - toilet;Grab bars - tub/shower      Prior Function   Level of Independence Independent with basic ADLs    Vocation Unemployed      Cognition   Overall Cognitive Status Within Functional Limits for tasks assessed      Observation/Other Assessments   Focus on Therapeutic Outcomes (FOTO)  62% limited   predicted 42% limited     Posture/Postural Control   Posture/Postural Control Postural limitations    Postural Limitations Decreased lumbar lordosis;Decreased thoracic kyphosis      ROM / Strength   AROM / PROM / Strength AROM;Strength;PROM      AROM   AROM Assessment Site Lumbar    Lumbar Flexion 88   pain with returning back to erect position   Lumbar Extension 20    Lumbar - Right Side Bend 38    Lumbar - Left Side Bend 38    Lumbar - Right Rotation 50%    Lumbar - Left Rotation 50%      Strength   Strength Assessment Site Hip;Knee    Right/Left Hip Right;Left    Right Hip Flexion 4+/5    Right Hip Extension 4-/5    Right Hip ABduction 4-/5    Left Hip Flexion 4+/5    Left Hip Extension 4-/5    Left Hip ABduction 4-/5    Right/Left Knee Right;Left    Right Knee Flexion 5/5    Right Knee Extension 5/5    Left Knee Flexion 5/5    Left Knee Extension 5/5      Palpation   Palpation comment TTP along bil lumbar paraspinals, tenderness along L1-L3 spinous process      Special Tests    Special Tests Lumbar    Lumbar Tests other      other   Findings Positive    Comments prone instability reported decreased pain       Standardized Balance Assessment   Standardized Balance Assessment --                      Objective measurements completed on examination: See above findings.       [facility] Adult PT Treatment/Exercise - 03/11/20 0001      Exercises   Exercises Lumbar      Lumbar Exercises: Prone   Opposite Arm/Leg Raise Left arm/Right leg;Right arm/Left  leg;10 reps      Manual Therapy   Manual therapy comments skilled palpation and monitoring of pt throughout TPDN  Trigger Point Dry Needling - 03/11/20 0001    Consent Given? Yes    Education Handout Provided Yes    Muscles Treated Back/Hip Lumbar multifidi    Lumbar multifidi Response Twitch response elicited;Palpable increased muscle length   L1- L3  bil               PT Education - 03/11/20 1542    Education Details evaluation findings, POC, goals, HEP with proper form/ rationale.    Person(s) Educated Patient    Methods Explanation;Verbal cues;Handout    Comprehension Verbalized understanding;Verbal cues required            PT Short Term Goals - 03/11/20 1732      PT SHORT TERM GOAL #1   Title pt to be I with inital HEP    Time 3    Period Weeks    Status New    Target Date 04/01/20      PT SHORT TERM GOAL #2   Title pt to verbalize/ demo efficient posture and lifting mechanics to reduce and prevent low back pain    Time 3    Period Weeks    Status New    Target Date 04/01/20             PT Long Term Goals - 03/11/20 1733      PT LONG TERM GOAL #1   Title pt to increase hip extensor/ abductor strength to >/= 4+/5 to promote stability and facilitate efficient lifting mechanics    Time 6    Period Weeks    Status New    Target Date 04/22/20      PT LONG TERM GOAL #2   Title pt to report max pain to </= 3/10 for functional improvment and QOL    Time 6    Period Weeks    Status New    Target Date 04/22/20      PT LONG TERM GOAL #3   Title pt to be able to stand and walk for >/ 30 min with </= 3/10 pain for functional endurance required for community mobility    Time 6    Period Weeks    Status New    Target Date 04/22/20      PT LONG TERM GOAL #4   Title increase FOTO score to </= 47% limited to demo improvement in function    Time 6    Period Weeks    Status New    Target Date 04/22/20      PT LONG TERM GOAL #5   Title pt  to be IND with all HEP given to maintain and progress current LOF IND    Time 6    Period Weeks    Status New    Target Date 04/22/20                  Plan - 03/11/20 1602    Clinical Impression Statement pt presents to OPPT with CC of chronic low back pain starting in April of 2020 following L4-L5 laminectomy/ fusion on 09/18/2018. He reports bil LE referred symtpoms that only occurs without medication for pain. He has functional trunk ROM and weakness in the hip abductors/ extensors. Pt responded well to prone instability testing noting decreased soreness during testing. edcuated and consent was provided for TPDN along lumbar multofidi which he responded well to in the session. He would benefit from physical therapy to decrease low back pain, increase hip/ core strength, increase endurance  and maximize overall function by addressing the deficits listed.    Personal Factors and Comorbidities Comorbidity 2    Comorbidities hx of lumbar fusion/ lamenectomy, and athritis.    Examination-Activity Limitations Squat;Lift;Locomotion Level;Sit;Stand    Stability/Clinical Decision Making Evolving/Moderate complexity    Clinical Decision Making Moderate    Rehab Potential Good    PT Frequency 2x / week    PT Duration 6 weeks    PT Treatment/Interventions ADLs/Self Care Home Management;Cryotherapy;Electrical Stimulation;Iontophoresis 4mg /ml Dexamethasone;Moist Heat;Ultrasound;Traction;Therapeutic exercise;Therapeutic activities;Patient/family education;Passive range of motion;Dry needling;Taping;Manual techniques    PT Next Visit Plan reviewe/ update HEP PRN, provide and review FOTO handout. response to TPDN, low back and core strnegthening, endurance training, posture education    PT Home Exercise Plan [id number] - LTR, prone bird dog, prone hip extension, sidelying hip abduction, SKTC    Consulted and Agree with Plan of Care Patient           Patient will benefit from skilled therapeutic  intervention in order to improve the following deficits and impairments:  Improper body mechanics, Decreased strength, Abnormal gait, Increased muscle spasms, Postural dysfunction, Decreased endurance, Decreased cognition, Decreased balance, Pain, Decreased range of motion  Visit Diagnosis: Chronic bilateral low back pain, unspecified whether sciatica present  Muscle weakness (generalized)  Abnormal posture     Problem List Patient Active Problem List   Diagnosis Date Noted  . S/P lumbar fusion 09/18/2018   11/17/2018 PT, DPT, LAT, ATC  03/11/20  5:39 PM      [facility] [address] [place], [place], [place] Phone: [phone]   Fax:  [phone]  Name: Udell Mazzocco MRN: Evon Slack Date of Birth: 1975-10-20

## 2020-03-11 NOTE — Progress Notes (Signed)
Rodney Gallagher - 44 y.o. male MRN [id number]  Date of birth: 1976/07/08  Office Visit Note: Visit Date: 03/09/2020 PCP: Tanna FurryZhou-Talbert, Serena S, MD Referred by: Tanna FurryZhou-Talbert, Serena S,*  Subjective: Chief Complaint  Patient presents with  . Lower Back - Pain   HPI: Rodney Gallagher is a 44 y.o. male who comes in today At the request of Dr. Minerva FesterSerena Talbert for evaluation management of chronic worsening severe low back pain.  He reports a history of back pain for 3 or more years and in fact describes 3 years and 7 months exactly.  He reports an injury on the job with lifting a cover or something with a machine with a coworker and having back pain since that time.  Reviewing a lot of the notes that we have in the chart from Dr. Linton Rumpalbert as well as [facility] at least some of the written records shows that he may have had the work injury in 2018 but may have had some issue with back pain in 2014.  He initially had gone through treatment through [employer] but it was not deemed to be a [employer] issue and there is some confusion on that but it seems to be that there is some ongoing issue with that.  He did tell me that they ended up with some settling of the [employer] issue.  Nonetheless patient did obtain MRI in 2019 and then ultimately saw Dr. Marikay Alaravid Jones in [place] at [facility].  He had had physical therapy and chiropractic care as well as medication management including opioid treatment.  He ended up with lumbar surgery in 2020 by Dr. Marikay Alaravid Jones.  Again all these notes were able to be reviewed and those were reviewed by me today.  On 09/18/2018 he underwent L4-5 laminectomy decompression with interbody instrumented fusion.  He reports that several months he had really good resolution of his symptoms and then the symptoms started to return and there was no new trauma at this point.  MRI and CT scan were performed by  Dr. Yetta BarreJones and those are reviewed below and reviewed with the patient today.  These show surgical level well-perfused with open canal.  Patient has some level of congenital canal narrowing and above and below the fusion there is mild to moderate facet joint changes and at L3-4 moderate foraminal narrowing.  There is no high-grade stenosis no nerve compression.  Patient reports severe pain across the lower back despite medication treatment with opioids.  He denies any leg pain.  He really has stopped taking gabapentin at this point although he took that for a while.  He has had injections prior to the surgery but those never helped.  He has not had any injection type treatment since the surgery.  He has been unable to return to see Dr. Marikay Alaravid Jones do to the fact that he has no insurance at this point and we were the only ones that could see him because he has Medicaid and may have some type of [place] medical support.  He has not had recent physical therapy.  He has been taking naproxen and oxycodone and is really cut down his other medications.  Oxycodone was being managed by Dr. Adah Perlawkwa and this was reviewed on the controlled substance list.  He reports this is now being managed by his primary care provider Dr. Linton Rumpalbert.  He reports 6 out of 6 constant sharp pain worse with activity better with rest and  lying down and says he functions well as long as he takes his medicine.  He denies any specific focal weakness paresthesia or bowel or bladder issues.  Review of Systems  Musculoskeletal: Positive for back pain and joint pain.  All other systems reviewed and are negative.  Otherwise per HPI.  Assessment & Plan: Visit Diagnoses:  1. Chronic midline low back pain without sciatica   2. Post laminectomy syndrome   3. Myofascial pain     Plan: Findings:  Chronic severe recalcitrant low back pain with exacerbation since surgery in 2020 by Dr. Marikay Alar.  Complicated initial findings of started out as a  workers comp type injury multiple bouts through the emergency departments and therapy and medications including opioid treatment and surgery.  Actually got some relief after surgery but then return of complaints in the lower back.  Reports being functionally better with opioid treatment.  This is currently maintained by his primary care physician per the patient.  Per the controlled substance database last prescription was by Dr. Adah Perl.  At this point with exam would recommend focused physical therapy with potential for dry needling and trigger point management and manual treatment.  We will also potentially look at diagnostic medial branch blocks above or below the fusion level to see if there was any relief.  If diagnostically that was better with pain diary would look at ablation.  Information about radiofrequency ablation given to the patient.  Patient also may end up being a good candidate for spinal cord stimulator trial which would be a procedure of last resort given the fact that he is really tried everything else at this point.  At least to my eyes reviewing the current images there is no further surgical issue.  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  He will continue to receive medications from his primary care physician.  I did explain to him that we are not in the situation to provide chronic opioid treatment as part of the orthopedic nonoperative spine model.  He could benefit from other treatment such as SNRI or other adjunctive medications.  He wants to hold off on any procedures at this point he does want to be referred for physical therapy.    Meds & Orders: No orders of the defined types were placed in this encounter.   Orders Placed This Encounter  Procedures  . Ambulatory referral to Physical Therapy    Follow-up: Return if symptoms worsen or fail to improve.   Procedures: No procedures performed  No notes on file   Clinical History: CT  LUMBAR SPINE WITHOUT CONTRAST    COMPARISON: Lumbar MRI 11/11/2019    FINDINGS:   Alignment: Preservation of the normal lumbar lordosis. No  spondylolysis or spondylolisthesis. Posterior spinal fusion L4-5  with interbody spacer placement.    Prior posterior spinal fusion and decompression at L4-5 with  posterior fusion rod secured by bilateral transpedicular fusion  screws with an interbody spacer at the L4-5 level. Fusion hardware  is intact and in expected alignment without periprosthetic lucency  of the screw tracks. There is slight subsidence of the L4 inferior  endplate approximately 3 mm with minimal change of the superior  endplate L5 as well.    Mild sclerotic discogenic endplate changes at the T12 superior  endplate with vacuum disc phenomenon. Mild arthrosis is noted at the  SI joints predominantly with sclerosis on the iliac side of the SI  articulation as  well as an exuberant bony osteophyte seen along the  anterior left SI joint.     Disc levels:    Level by level evaluation of the lumbar spine below:    T11-T12: Sclerotic endplate changes, disc height loss and vacuum  disc phenomenon. No significant spinal canal stenosis or neural  foraminal narrowing.     L2-L3: Mild global disc bulge and left facet hypertrophy. No  significant spinal canal stenosis or foraminal narrowing.    L3-L4: Mild global disc bulge. Mild bilateral facet arthropathy.  Moderate bilateral neural foraminal narrowing and narrowing of the  left lateral recess. No spinal canal stenosis.    L4-L5: Operative level with posterior canal decompressive changes  including what appears to be a partial bilateral facetectomy. No  significant canal stenosis or residual neural foraminal narrowing is  evident.    L5-S1: Mild global disc bulge. Moderate bilateral facet arthropathy.  Mild bilateral foraminal narrowing. No significant canal stenosis.    IMPRESSION:  1. Prior posterior  spinal fusion and decompression at L4-5 with  interbody spacer placement. Fusion hardware is intact without  features of hardware loosening.  2. Slight subsidence of the L4 inferior endplate with minimal change  of the superior endplate L5 as well.  3. No significant canal stenosis or residual neural foraminal  narrowing at the operative level.  4. Moderate bilateral neural foraminal narrowing and narrowing of  the left lateral recess at L3-L4. Mild bilateral foraminal narrowing  L5-S1 as well.  5. Mild bilateral SI arthrosis, left greater than right.    Electronically Signed  By: Kreg Shropshire M.D.  On: 12/05/2019 20:53  --- MRI LUMBAR SPINE WITHOUT CONTRAST  FINDINGS:  Vertebrae: There are interval postoperative changes at L4-L5 including posterior decompression, pedicle screw fixation, and interbody spacer. The hardware is not well evaluated on this study and there is associated susceptibility artifact.  Conus extends to the L2 level. Conus and cauda equina appear normal.  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 Imaged in the sagittal plane only, there is a small disc bulge or protrusion at T11-T12 also present on the prior study.  L2-L3: Minimal disc bulge. Mild facet arthropathy. No significant canal or foraminal stenosis.  L3-L4: Minimal disc bulge and mild facet arthropathy. No significant canal stenosis. Mild foraminal stenosis, left greater than right.  L4-L5: Operative level. Endplate osteophytes and facet arthropathy. The canal has been decompressed. Foraminal stenosis is also improved.  L5-S1: Minimal disc bulge and mild facet arthropathy. No significant canal stenosis. Minor foraminal stenosis.  IMPRESSION: Operative and degenerative changes as detailed above. Canal and foramina have been decompressed at L4-L5. No new significant stenosis.  Electronically Signed   By: Guadlupe Spanish M.D.   On: 11/11/2019 09:41    He reports that he quit smoking about 2 years ago. He has never used smokeless tobacco. No results for input(s): HGBA1C, LABURIC in the last 8760 hours.  Objective:  VS:  HT:    WT:   BMI:     BP:(!) 145/96  HR:83bpm  TEMP: ( )  RESP:  Physical Exam Vitals and nursing note reviewed.  Constitutional:      General: He is not in acute distress.    Appearance: Normal appearance. He is well-developed. He is obese.  HENT:     Head: Normocephalic and atraumatic.  Eyes:     Conjunctiva/sclera: Conjunctivae normal.     Pupils: Pupils are equal, round, and reactive to light.  Cardiovascular:  Rate and Rhythm: Normal rate.     Pulses: Normal pulses.     Heart sounds: Normal heart sounds.  Pulmonary:     Effort: Pulmonary effort is normal. No respiratory distress.  Musculoskeletal:     Cervical back: Normal range of motion and neck supple. No rigidity.     Right lower leg: No edema.     Left lower leg: No edema.     Comments: Patient slow to rise from seated position to full extension does have pain with facet loading and extension.  He does have pain with taut bands in the paraspinal region.  He has no pain over the greater trochanters.  Has no pain with hip rotation he has good distal strength without clonus.  Skin:    General: Skin is warm and dry.     Findings: No erythema or rash.  Neurological:     General: No focal deficit present.     Mental Status: He is alert and oriented to person, place, and time.     Sensory: No sensory deficit.     Coordination: Coordination normal.     Gait: Gait normal.  Psychiatric:        Mood and Affect: Mood normal.        Behavior: Behavior normal.     Ortho Exam  Imaging: No results found.  Past Medical/Family/Surgical/Social History: Medications & Allergies reviewed per EMR, new medications updated. Patient Active Problem List   Diagnosis Date Noted  . S/P lumbar fusion 09/18/2018   Past Medical History:  Diagnosis Date  .  Arthritis    in back  . Herniation of lumbar intervertebral disc   . Hypertension    History reviewed. No pertinent family history. History reviewed. No pertinent surgical history. Social History   Occupational History  . Not on file  Tobacco Use  . Smoking status: Former Smoker    Quit date: 03/13/2017    Years since quitting: 2.9  . Smokeless tobacco: Never Used  Vaping Use  . Vaping Use: Every day  Substance and Sexual Activity  . Alcohol use: Not Currently  . Drug use: Yes    Types: Marijuana    Comment: uses every day, as well as CBD  . Sexual activity: Not on file

## 2020-03-23 ENCOUNTER — Ambulatory Visit: Payer: Medicaid Other | Attending: Physical Medicine and Rehabilitation | Admitting: Physical Therapy

## 2020-03-23 ENCOUNTER — Encounter: Payer: Self-pay | Admitting: Physical Therapy

## 2020-03-23 ENCOUNTER — Other Ambulatory Visit: Payer: Self-pay

## 2020-03-23 DIAGNOSIS — G8929 Other chronic pain: Secondary | ICD-10-CM | POA: Insufficient documentation

## 2020-03-23 DIAGNOSIS — M545 Low back pain, unspecified: Secondary | ICD-10-CM

## 2020-03-23 DIAGNOSIS — M6281 Muscle weakness (generalized): Secondary | ICD-10-CM

## 2020-03-23 DIAGNOSIS — R293 Abnormal posture: Secondary | ICD-10-CM | POA: Insufficient documentation

## 2020-03-23 NOTE — Therapy (Signed)
[facility] [address] [place], [place], [address] Phone: [phone]   Fax:  [phone]  Physical Therapy Treatment  Patient Details  Name: Rodney Gallagher MRN: [id number] Date of Birth: Feb 08, 1976 Referring Provider (PT): Tyrell Antonio, MD    Encounter Date: 03/23/2020   PT End of Session - 03/23/20 1556    Visit Number 2    Number of Visits 13    Date for PT Re-Evaluation 04/22/20    Authorization Type PT reports having CAFA    PT Start Time 1548    PT Stop Time 1628    PT Time Calculation (min) 40 min    Activity Tolerance Patient tolerated treatment well    Behavior During Therapy [facility] for tasks assessed/performed           Past Medical History:  Diagnosis Date  . Arthritis    in back  . Herniation of lumbar intervertebral disc   . Hypertension     Past Surgical History:  Procedure Laterality Date  . LUMBAR FUSION  09/18/2018   combined with laminectomy of L4-L5    There were no vitals filed for this visit.   Subjective Assessment - 03/23/20 1552    Subjective Pt reports he is doing okay today. He thought the needling really helped. Pt has been doing some of the exercises at home, but not the stretches.    Pertinent History L4-L5 fusion/ laminectomy on 09/18/2018    Limitations Lifting;Walking;Standing    How long can you sit comfortably? 1 hours    How long can you stand comfortably? 20 min    How long can you walk comfortably? 5-7 min    Diagnostic tests 4/23/2020IMPRESSION:1. Prior posterior spinal fusion and decompression at L4-5 withinterbody spacer placement. Fusion hardware is intact withoutfeatures of hardware loosening.2. Slight subsidence of the L4 inferior endplate with minimal changeof the superior endplate L5 as well.3. No significant canal stenosis or residual neural foraminalnarrowing at the operative level.4. Moderate bilateral neural foraminal narrowing and narrowing ofthe left lateral  recess at L3-L4. Mild bilateral foraminal narrowingL5-S1 as well.5. Mild bilateral SI arthrosis, left greater than right.    Patient Stated Goals reduce pain as much as possible, avoid surgery as much as possible.    Currently in Pain? Yes    Pain Score 6     Pain Location Back    Pain Orientation Right;Left;Lower    Pain Descriptors / Indicators Aching;Stabbing;Sharp    Pain Type Chronic pain    Pain Radiating Towards without medication down bil LE into the feet    Pain Onset More than a month ago    Pain Frequency Constant    Aggravating Factors  getting out of bed in the morning, walking, standing, lifting, constant motion    Pain Relieving Factors pain medication, losing weight, intermittent breaks, laying down    Effect of Pain on Daily Activities limited endurance                             [facility] Adult PT Treatment/Exercise - 03/23/20 0001      Lumbar Exercises: Aerobic   Nustep L4x5 min      Lumbar Exercises: Seated   Other Seated Lumbar Exercises anterior pelvic tilt on physioball x15    Other Seated Lumbar Exercises Marching on physioball x15 each leg maintaining anterior pelvic tilt      Lumbar Exercises: Supine   Dead Bug 20 reps  tapping heel on table; cuing to keep lower back on tbale   Dead Bug Limitations each leg    Other Supine Lumbar Exercises Table top position moving arms above head 2x10 reps; reaching to opposite knee x10 each arm      Lumbar Exercises: Sidelying   Hip Abduction 20 reps;Right;Left      Manual Therapy   Manual Therapy Soft tissue mobilization    Soft tissue mobilization to lower thoracic and upper lumbar paraspinals            Trigger Point Dry Needling - 03/23/20 0001    Consent Given? Yes    Education Handout Provided Previously provided    Muscles Treated Back/Hip Lumbar multifidi    Lumbar multifidi Response Twitch response elicited;Palpable increased muscle length   performed bil trained clinician,  thoroacolumbar bil               PT Education - 03/23/20 1555    Education Details HEP, dry needling, benefits of core and hip strengthening    Person(s) Educated Patient    Methods Explanation;Demonstration;Verbal cues    Comprehension Verbalized understanding;Returned demonstration            PT Short Term Goals - 03/11/20 1732      PT SHORT TERM GOAL #1   Title pt to be I with inital HEP    Time 3    Period Weeks    Status New    Target Date 04/01/20      PT SHORT TERM GOAL #2   Title pt to verbalize/ demo efficient posture and lifting mechanics to reduce and prevent low back pain    Time 3    Period Weeks    Status New    Target Date 04/01/20             PT Long Term Goals - 03/11/20 1733      PT LONG TERM GOAL #1   Title pt to increase hip extensor/ abductor strength to >/= 4+/5 to promote stability and facilitate efficient lifting mechanics    Time 6    Period Weeks    Status New    Target Date 04/22/20      PT LONG TERM GOAL #2   Title pt to report max pain to </= 3/10 for functional improvment and QOL    Time 6    Period Weeks    Status New    Target Date 04/22/20      PT LONG TERM GOAL #3   Title pt to be able to stand and walk for >/ 30 min with </= 3/10 pain for functional endurance required for community mobility    Time 6    Period Weeks    Status New    Target Date 04/22/20      PT LONG TERM GOAL #4   Title increase FOTO score to </= 47% limited to demo improvement in function    Time 6    Period Weeks    Status New    Target Date 04/22/20      PT LONG TERM GOAL #5   Title pt to be IND with all HEP given to maintain and progress current LOF IND    Time 6    Period Weeks    Status New    Target Date 04/22/20                 Plan - 03/23/20 1629    Clinical Impression Statement  Pt continues to have back pain and tightness. TPDN was done on the lower thoracic and upper lumbar multifidi bilaterally to release trigger  points. Twitch response was elicited. STW was done to bilaterally to paraspinals to release muscle tightness. Therapy focused on core strengthening to increase stability for the back. Pt required cuing to keep lower back flat on the table to decrease lumbar extension during dead bugs. Worked on core stabilization on the physioball by maintaining anterior pelvic tilt with marching. He had difficulty maintaining his balance and had to put his hands on his knees. Pt tolerated treatment well and reported decreased tightness at the end of the session.    Personal Factors and Comorbidities Comorbidity 2    Comorbidities hx of lumbar fusion/ lamenectomy, and athritis.    Examination-Activity Limitations Squat;Lift;Locomotion Level;Sit;Stand    Stability/Clinical Decision Making Evolving/Moderate complexity    Clinical Decision Making Moderate    Rehab Potential Good    PT Frequency 2x / week    PT Duration 6 weeks    PT Treatment/Interventions ADLs/Self Care Home Management;Cryotherapy;Electrical Stimulation;Iontophoresis 4mg /ml Dexamethasone;Moist Heat;Ultrasound;Traction;Therapeutic exercise;Therapeutic activities;Patient/family education;Passive range of motion;Dry needling;Taping;Manual techniques    PT Next Visit Plan reviewe/ update HEP PRN, provide and review FOTO handout. response to TPDN, low back and core strnegthening, endurance training, posture education    PT Home Exercise Plan [id number] - LTR, prone bird dog, prone hip extension, sidelying hip abduction, SKTC    Consulted and Agree with Plan of Care Patient           Patient will benefit from skilled therapeutic intervention in order to improve the following deficits and impairments:  Improper body mechanics, Decreased strength, Abnormal gait, Increased muscle spasms, Postural dysfunction, Decreased endurance, Decreased cognition, Decreased balance, Pain, Decreased range of motion  Visit Diagnosis: Chronic bilateral low back pain,  unspecified whether sciatica present  Muscle weakness (generalized)  Abnormal posture     Problem List Patient Active Problem List   Diagnosis Date Noted  . S/P lumbar fusion 09/18/2018    11/17/2018 SPT 03/23/2020, 5:21 PM  [facility] [address] [place], [place], [place] Phone: [phone]   Fax:  [phone]  Name: Azariel Banik MRN: Evon Slack Date of Birth: 1975/10/30

## 2020-03-25 ENCOUNTER — Ambulatory Visit: Payer: Medicaid Other | Admitting: Physical Therapy

## 2020-03-30 ENCOUNTER — Encounter: Payer: Self-pay | Admitting: Physical Therapy

## 2020-03-30 ENCOUNTER — Other Ambulatory Visit: Payer: Self-pay

## 2020-03-30 ENCOUNTER — Ambulatory Visit: Payer: Medicaid Other | Admitting: Physical Therapy

## 2020-03-30 DIAGNOSIS — M6281 Muscle weakness (generalized): Secondary | ICD-10-CM

## 2020-03-30 DIAGNOSIS — R293 Abnormal posture: Secondary | ICD-10-CM

## 2020-03-30 DIAGNOSIS — G8929 Other chronic pain: Secondary | ICD-10-CM

## 2020-03-30 DIAGNOSIS — M545 Low back pain, unspecified: Secondary | ICD-10-CM

## 2020-03-30 NOTE — Therapy (Addendum)
[place], [place], [address] Phone: [phone]   Fax:  [phone]  Physical Therapy Treatment  Patient Details  Name: Rodney Gallagher MRN: [id number] Date of Birth: 03-18-1976 Referring Provider (PT): Magnus Sinning, MD    Encounter Date: 03/30/2020   PT End of Session - 03/30/20 1104    Visit Number 3    Number of Visits 13    Date for PT Re-Evaluation 04/22/20    Authorization Type PT reports having CAFA    PT Start Time 1102    PT Stop Time 1143    PT Time Calculation (min) 41 min    Activity Tolerance Patient tolerated treatment well    Behavior During Therapy [facility] for tasks assessed/performed           Past Medical History:  Diagnosis Date  . Arthritis    in back  . Herniation of lumbar intervertebral disc   . Hypertension     Past Surgical History:  Procedure Laterality Date  . LUMBAR FUSION  09/18/2018   combined with laminectomy of L4-L5    There were no vitals filed for this visit.   Subjective Assessment - 03/30/20 1105    Subjective " i think the Dn really helped and consistency with the exercises makes difference."    Diagnostic tests 4/23/2020IMPRESSION:1. Prior posterior spinal fusion and decompression at L4-5 withinterbody spacer placement. Fusion hardware is intact withoutfeatures of hardware loosening.2. Slight subsidence of the L4 inferior endplate with minimal changeof the superior endplate L5 as well.3. No significant canal stenosis or residual neural foraminalnarrowing at the operative level.4. Moderate bilateral neural foraminal narrowing and narrowing ofthe left lateral recess at L3-L4. Mild bilateral foraminal narrowingL5-S1 as well.5. Mild bilateral SI arthrosis, left greater than right.    Patient Stated Goals reduce pain as much as possible, avoid surgery as much as possible.    Currently in Pain? Yes    Pain Score 5               [facility] PT Assessment -  03/30/20 0001      Assessment   Medical Diagnosis Chronic midline low back pain without sciatica M54.5, G89.29, Post laminectomy syndrome M96.1, Myofascial pain M79.18    Referring Provider (PT) Magnus Sinning, MD                          [facility] Adult PT Treatment/Exercise - 03/30/20 0001      Lumbar Exercises: Stretches   Other Lumbar Stretch Exercise low back stretch walking hands out into chair      Lumbar Exercises: Aerobic   Nustep L5 x 6 min UE/LE      Lumbar Exercises: Standing   Other Standing Lumbar Exercises pressing down into red physioball with arms extended, 2 x 15 breathing out while pressing, and in with release.       Shoulder Exercises: Seated   Horizontal ABduction Right;Both;12 reps;Theraband    Theraband Level (Shoulder Horizontal ABduction) Level 2 (Red)      Manual Therapy   Manual therapy comments skilled palpation and monitoring of pt throughout TPDN    Soft tissue mobilization IASTM along thoracolumbar paraspinals            Trigger Point Dry Needling - 03/30/20 0001    Consent Given? Yes    Muscles Treated Back/Hip Lumbar multifidi    Electrical Stimulation Performed with Dry Needling Yes    E-stim with Dry Needling  Details frequency at 15, x 8 min increasing intensity to tolerance intermittently    Lumbar multifidi Response Twitch response elicited;Palpable increased muscle length                PT Education - 03/30/20 1144    Education Details Reivewed FOTO and provided handout.    Person(s) Educated Patient    Methods Explanation;Verbal cues;Handout    Comprehension Verbalized understanding;Verbal cues required            PT Short Term Goals - 03/30/20 1145      PT SHORT TERM GOAL #1   Title pt to be I with inital HEP    Period Weeks    Status Partially Met      PT SHORT TERM GOAL #2   Title pt to verbalize/ demo efficient posture and lifting mechanics to reduce and prevent low back pain    Period Weeks     Status Partially Met             PT Long Term Goals - 03/11/20 1733      PT LONG TERM GOAL #1   Title pt to increase hip extensor/ abductor strength to >/= 4+/5 to promote stability and facilitate efficient lifting mechanics    Time 6    Period Weeks    Status New    Target Date 04/22/20      PT LONG TERM GOAL #2   Title pt to report max pain to </= 3/10 for functional improvment and QOL    Time 6    Period Weeks    Status New    Target Date 04/22/20      PT LONG TERM GOAL #3   Title pt to be able to stand and walk for >/ 30 min with </= 3/10 pain for functional endurance required for community mobility    Time 6    Period Weeks    Status New    Target Date 04/22/20      PT LONG TERM GOAL #4   Title increase FOTO score to </= 47% limited to demo improvement in function    Time 6    Period Weeks    Status New    Target Date 04/22/20      PT LONG TERM GOAL #5   Title pt to be IND with all HEP given to maintain and progress current LOF IND    Time 6    Period Weeks    Status New    Target Date 04/22/20                 Plan - 03/30/20 1145     pt reports relief rated at 5/10 today since the previous session and reports more consistency with his exericse. continued TPDN focusing on thoracolumbar multifidi combined with E-stm followed with IASTM techiques. He responsed well to core and shoulder strengthening. end of session he reported decreased pain rated 3-4/10.   PT Treatment/Interventions ADLs/Self Care Home Management;Cryotherapy;Electrical Stimulation;Iontophoresis 5m/ml Dexamethasone;Moist Heat;Ultrasound;Traction;Therapeutic exercise;Therapeutic activities;Patient/family education;Passive range of motion;Dry needling;Taping;Manual techniques    PT Next Visit Plan reviewe/ update HEP PRN, response to TPDN, low back and core strnegthening, endurance training, posture education, Review TENS unit    PT Home Exercise Plan [id number] - LTR, prone bird dog, prone  hip extension, sidelying hip abduction, SKTC    Consulted and Agree with Plan of Care Patient           Patient will benefit from skilled therapeutic  intervention in order to improve the following deficits and impairments:  Improper body mechanics, Decreased strength, Abnormal gait, Increased muscle spasms, Postural dysfunction, Decreased endurance, Decreased cognition, Decreased balance, Pain, Decreased range of motion  Visit Diagnosis: Chronic bilateral low back pain, unspecified whether sciatica present  Muscle weakness (generalized)  Abnormal posture     Problem List Patient Active Problem List   Diagnosis Date Noted  . S/P lumbar fusion 09/18/2018    Starr Lake PT, DPT, LAT, ATC  03/30/20  11:46 AM      [place], [place], [address] Phone: [phone]   Fax:  [phone]  Name: Robbert Langlinais MRN: [id number] Date of Birth: 08/03/1976     Starr Lake PT, DPT, LAT, ATC  04/01/20  11:00 AM

## 2020-03-31 ENCOUNTER — Ambulatory Visit (HOSPITAL_COMMUNITY): Payer: Medicaid Other | Admitting: Physical Therapy

## 2020-04-01 ENCOUNTER — Encounter: Payer: Self-pay | Admitting: Physical Therapy

## 2020-04-01 ENCOUNTER — Ambulatory Visit: Payer: Medicaid Other | Admitting: Physical Therapy

## 2020-04-01 ENCOUNTER — Other Ambulatory Visit: Payer: Self-pay

## 2020-04-01 DIAGNOSIS — M545 Low back pain, unspecified: Secondary | ICD-10-CM

## 2020-04-01 DIAGNOSIS — R293 Abnormal posture: Secondary | ICD-10-CM

## 2020-04-01 DIAGNOSIS — M6281 Muscle weakness (generalized): Secondary | ICD-10-CM

## 2020-04-01 DIAGNOSIS — G8929 Other chronic pain: Secondary | ICD-10-CM

## 2020-04-01 NOTE — Patient Instructions (Addendum)

## 2020-04-01 NOTE — Therapy (Signed)
[place], [place], [address] Phone: [phone]   Fax:  [phone]  Physical Therapy Treatment  Patient Details  Name: Rodney Gallagher MRN: [id number] Date of Birth: 06/21/1976 Referring Provider (PT): Magnus Sinning, MD    Encounter Date: 04/01/2020   PT End of Session - 04/01/20 1058    Visit Number 4    Number of Visits 13    Date for PT Re-Evaluation 04/22/20    Authorization Type PT reports having CAFA    PT Start Time 1059    PT Stop Time 1141    PT Time Calculation (min) 42 min    Activity Tolerance Patient tolerated treatment well    Behavior During Therapy [facility] for tasks assessed/performed           Past Medical History:  Diagnosis Date  . Arthritis    in back  . Herniation of lumbar intervertebral disc   . Hypertension     Past Surgical History:  Procedure Laterality Date  . LUMBAR FUSION  09/18/2018   combined with laminectomy of L4-L5    There were no vitals filed for this visit.   Subjective Assessment - 04/01/20 1101    Subjective "I am little bit better today, the DN with electricity helped and it lasted longer."    Diagnostic tests 4/23/2020IMPRESSION:1. Prior posterior spinal fusion and decompression at L4-5 withinterbody spacer placement. Fusion hardware is intact withoutfeatures of hardware loosening.2. Slight subsidence of the L4 inferior endplate with minimal changeof the superior endplate L5 as well.3. No significant canal stenosis or residual neural foraminalnarrowing at the operative level.4. Moderate bilateral neural foraminal narrowing and narrowing ofthe left lateral recess at L3-L4. Mild bilateral foraminal narrowingL5-S1 as well.5. Mild bilateral SI arthrosis, left greater than right.    Currently in Pain? Yes    Pain Score 6     Pain Orientation Right    Pain Descriptors / Indicators Aching    Pain Type Chronic pain              [facility] PT Assessment -  04/01/20 0001      Assessment   Medical Diagnosis Chronic midline low back pain without sciatica M54.5, G89.29, Post laminectomy syndrome M96.1, Myofascial pain M79.18    Referring Provider (PT) Magnus Sinning, MD                          [facility] Adult PT Treatment/Exercise - 04/01/20 0001      Lumbar Exercises: Aerobic   Nustep L5 x 6 min UE/LE      Lumbar Exercises: Standing   Other Standing Lumbar Exercises palloff press with green theraband 2  x 20 bil       Knee/Hip Exercises: Standing   Hip Abduction Stengthening;Both;2 sets;15 reps;Knee straight   with red theraband around anakles   Hip Extension Right;Both;2 sets;Knee straight;15 reps   with red theraband around ankles     Shoulder Exercises: Standing   Extension Strengthening;15 reps;Theraband    Theraband Level (Shoulder Extension) Level 3 (Green)    Row 15 reps;Theraband    Theraband Level (Shoulder Row) Level 3 (Green)      Manual Therapy   Manual Therapy Taping    Manual therapy comments skilled palpation and monitoring of pt throughout TPDN    Soft tissue mobilization --    Kinesiotex Facilitate Muscle      Kinesiotix   Facilitate Muscle  bil paraspinals  Trigger Point Dry Needling - 04/01/20 0001    Consent Given? Yes    Education Handout Provided Previously provided    Muscles Treated Back/Hip Lumbar multifidi    Electrical Stimulation Performed with Dry Needling Yes    E-stim with Dry Needling Details frequency at 15, x 10 min increasing intensity to tolerance intermittently    Lumbar multifidi Response Twitch response elicited;Palpable increased muscle length                PT Education - 04/01/20 1110    Education Details Reviewed TENS units and benefits when used in combination with exercise. Updated HEP for standing hip strengthening.    Person(s) Educated Patient    Methods Explanation;Verbal cues;Handout    Comprehension Verbalized understanding;Verbal cues  required            PT Short Term Goals - 03/30/20 1145      PT SHORT TERM GOAL #1   Title pt to be I with inital HEP    Period Weeks    Status Partially Met      PT SHORT TERM GOAL #2   Title pt to verbalize/ demo efficient posture and lifting mechanics to reduce and prevent low back pain    Period Weeks    Status Partially Met             PT Long Term Goals - 03/11/20 1733      PT LONG TERM GOAL #1   Title pt to increase hip extensor/ abductor strength to >/= 4+/5 to promote stability and facilitate efficient lifting mechanics    Time 6    Period Weeks    Status New    Target Date 04/22/20      PT LONG TERM GOAL #2   Title pt to report max pain to </= 3/10 for functional improvment and QOL    Time 6    Period Weeks    Status New    Target Date 04/22/20      PT LONG TERM GOAL #3   Title pt to be able to stand and walk for >/ 30 min with </= 3/10 pain for functional endurance required for community mobility    Time 6    Period Weeks    Status New    Target Date 04/22/20      PT LONG TERM GOAL #4   Title increase FOTO score to </= 47% limited to demo improvement in function    Time 6    Period Weeks    Status New    Target Date 04/22/20      PT LONG TERM GOAL #5   Title pt to be IND with all HEP given to maintain and progress current LOF IND    Time 6    Period Weeks    Status New    Target Date 04/22/20                 Plan - 04/01/20 1141    Clinical Impression Statement pt noted some relief following last session for a few hours but rpeorts continued 6/10 pain today. continued TPDN combined with E-stim for thoracolumbar multifidi combined with e-stim. Trialed KT tape to promote muscle activation for support. He did well with hip and shoulder strnegthening reporting fatigue and mild soreness inthe low back.    PT Treatment/Interventions ADLs/Self Care Home Management;Cryotherapy;Electrical Stimulation;Iontophoresis 63m/ml Dexamethasone;Moist  Heat;Ultrasound;Traction;Therapeutic exercise;Therapeutic activities;Patient/family education;Passive range of motion;Dry needling;Taping;Manual techniques    PT Next Visit Plan  reviewe/ update HEP PRN, response to TPDN, low back and core strnegthening, endurance training, posture education, hip/ shouder strengthening    PT Home Exercise Plan [id number] - LTR, prone bird dog, prone hip extension, sidelying hip abduction, SKTC, standing hip abduction/ extension with band    Consulted and Agree with Plan of Care Patient           Patient will benefit from skilled therapeutic intervention in order to improve the following deficits and impairments:  Improper body mechanics, Decreased strength, Abnormal gait, Increased muscle spasms, Postural dysfunction, Decreased endurance, Decreased cognition, Decreased balance, Pain, Decreased range of motion  Visit Diagnosis: Chronic bilateral low back pain, unspecified whether sciatica present  Muscle weakness (generalized)  Abnormal posture     Problem List Patient Active Problem List   Diagnosis Date Noted  . S/P lumbar fusion 09/18/2018   Starr Lake PT, DPT, LAT, ATC  04/01/20  11:45 AM      [facility] [address] [place], [place], [address] Phone: [phone]   Fax:  [phone]  Name: Rodney Gallagher MRN: [id number] Date of Birth: 06/05/1976

## 2020-04-05 ENCOUNTER — Ambulatory Visit: Payer: Self-pay | Admitting: Physical Therapy

## 2020-04-05 ENCOUNTER — Encounter: Payer: Self-pay | Admitting: Physical Therapy

## 2020-04-05 ENCOUNTER — Other Ambulatory Visit: Payer: Self-pay

## 2020-04-05 DIAGNOSIS — R293 Abnormal posture: Secondary | ICD-10-CM

## 2020-04-05 DIAGNOSIS — M6281 Muscle weakness (generalized): Secondary | ICD-10-CM

## 2020-04-05 DIAGNOSIS — M545 Low back pain, unspecified: Secondary | ICD-10-CM

## 2020-04-05 NOTE — Therapy (Addendum)
[place], [place], [address] Phone: [phone]   Fax:  [phone]  Physical Therapy Treatment / discharge  Patient Details  Name: Rodney Gallagher MRN: [id number] Date of Birth: 10/09/1975 Referring Provider (PT): Rodney Sinning, MD    Encounter Date: 04/05/2020   PT End of Session - 04/05/20 1448    Visit Number 5    Number of Visits 13    Date for PT Re-Evaluation 04/22/20    Authorization Type PT reports having CAFA    PT Start Time 2035    PT Stop Time 1435    PT Time Calculation (min) 18 min    Activity Tolerance Patient tolerated treatment well    Behavior During Therapy [facility] for tasks assessed/performed           Past Medical History:  Diagnosis Date  . Arthritis    in back  . Herniation of lumbar intervertebral disc   . Hypertension     Past Surgical History:  Procedure Laterality Date  . LUMBAR FUSION  09/18/2018   combined with laminectomy of L4-L5    There were no vitals filed for this visit.   Subjective Assessment - 04/05/20 1419    Subjective "I am unsure if the tape helped but it didn't make things worse. I didn't do the exercise this weekend."    Diagnostic tests 4/23/2020IMPRESSION:1. Prior posterior spinal fusion and decompression at L4-5 withinterbody spacer placement. Fusion hardware is intact withoutfeatures of hardware loosening.2. Slight subsidence of the L4 inferior endplate with minimal changeof the superior endplate L5 as well.3. No significant canal stenosis or residual neural foraminalnarrowing at the operative level.4. Moderate bilateral neural foraminal narrowing and narrowing ofthe left lateral recess at L3-L4. Mild bilateral foraminal narrowingL5-S1 as well.5. Mild bilateral SI arthrosis, left greater than right.    Currently in Pain? Yes    Pain Score 6     Pain Location Back    Pain Orientation Right    Pain Descriptors / Indicators Aching                                      PT Education - 04/05/20 1447    Education Details Chronic pain education and benefits of graded loading/ exercise, in combination with nuero eduation and cited stufies.    Person(s) Educated Patient    Methods Explanation;Verbal cues    Comprehension Verbalized understanding;Verbal cues required            PT Short Term Goals - 03/30/20 1145      PT SHORT TERM GOAL #1   Title pt to be I with inital HEP    Period Weeks    Status Partially Met      PT SHORT TERM GOAL #2   Title pt to verbalize/ demo efficient posture and lifting mechanics to reduce and prevent low back pain    Period Weeks    Status Partially Met             PT Long Term Goals - 03/11/20 1733      PT LONG TERM GOAL #1   Title pt to increase hip extensor/ abductor strength to >/= 4+/5 to promote stability and facilitate efficient lifting mechanics    Time 6    Period Weeks    Status New    Target Date 04/22/20      PT LONG TERM GOAL #  2   Title pt to report max pain to </= 3/10 for functional improvment and QOL    Time 6    Period Weeks    Status New    Target Date 04/22/20      PT LONG TERM GOAL #3   Title pt to be able to stand and walk for >/ 30 min with </= 3/10 pain for functional endurance required for community mobility    Time 6    Period Weeks    Status New    Target Date 04/22/20      PT LONG TERM GOAL #4   Title increase FOTO score to </= 47% limited to demo improvement in function    Time 6    Period Weeks    Status New    Target Date 04/22/20      PT LONG TERM GOAL #5   Title pt to be IND with all HEP given to maintain and progress current LOF IND    Time 6    Period Weeks    Status New    Target Date 04/22/20                 Plan - 04/05/20 1440    Clinical Impression Statement patient arrived reporting continued pain at 6/10.  Attempted to educate about chronic pain and benefits of controlled graded exercise  in combination with neuroscienece education, studies were stated and benefits were noted. Pt stated he isn't part of a study and that he has been in pain for 3 years and that he was unhappy because he was told that he is not used to exercise and delayed onset muscle soreness is common, despite reporting he does activities at home. Time was taken to discuss benefits of graded exposure as it would be specifically tailored to his deficits and the plan for treatment moving forward however pt noted he wants to transfer to [facility], and will be discharged from [facility] per pt's request.    PT Treatment/Interventions ADLs/Self Care Home Management;Cryotherapy;Electrical Stimulation;Iontophoresis 24m/ml Dexamethasone;Moist Heat;Ultrasound;Traction;Therapeutic exercise;Therapeutic activities;Patient/family education;Passive range of motion;Dry needling;Taping;Manual techniques    PT Next Visit Plan D/C    PT Home Exercise Plan [id number] - LTR, prone bird dog, prone hip extension, sidelying hip abduction, SKTC, standing hip abduction/ extension with band           Patient will benefit from skilled therapeutic intervention in order to improve the following deficits and impairments:  Improper body mechanics, Decreased strength, Abnormal gait, Increased muscle spasms, Postural dysfunction, Decreased endurance, Decreased cognition, Decreased balance, Pain, Decreased range of motion  Visit Diagnosis: Chronic bilateral low back pain, unspecified whether sciatica present  Muscle weakness (generalized)  Abnormal posture     Problem List Patient Active Problem List   Diagnosis Date Noted  . S/P lumbar fusion 09/18/2018    LStarr Lake8/23/2021, 2:53 PM  [facility] [place] [address]Phone: [phone]  Fax:  [phone] Name: Rodney NiblackMRN: [id number] of Birth: [birth year]   KStarr LakePT,  DPT, LAT, ATC  04/05/20  4:07 PM         PHYSICAL THERAPY DISCHARGE SUMMARY  Visits from Start of Care: 5  Current functional level related to goals / functional outcomes: See goals   Remaining deficits: See assessment, current status unknown as pt has not returned or set up appointments at any other clinic   Education / Equipment: HEP  Plan: Patient agrees to discharge.  Patient goals were not met. Patient is being discharged due to not returning since the last visit.  ?????        Dale Strausser PT, DPT, LAT, ATC  05/20/20  1:46 PM

## 2020-04-06 ENCOUNTER — Telehealth: Payer: Self-pay | Admitting: Physical Medicine and Rehabilitation

## 2020-04-06 NOTE — Telephone Encounter (Signed)
Please advise on scheduling

## 2020-04-06 NOTE — Telephone Encounter (Signed)
Not really sure about what a gel injection he is talking about.  My one note that I have seen on him would suggest that we would do bilateral medial branch blocks at L5-S1 but no specific comment on any sort of gel injection.

## 2020-04-06 NOTE — Telephone Encounter (Signed)
Patient called requesting gel injection appointment. Patient states he has [employer] financial assistance. Please call patient about this matter to see if financial assistance covers injections. Patient states he attends PT for dry needling and pains is still there. Patient phone number is [phone].

## 2020-04-08 NOTE — Telephone Encounter (Signed)
Scheduled for [facility]BB on 9/9 at 0745 with driver.

## 2020-04-09 ENCOUNTER — Ambulatory Visit: Payer: Medicaid Other | Admitting: Physical Therapy

## 2020-04-22 ENCOUNTER — Ambulatory Visit (INDEPENDENT_AMBULATORY_CARE_PROVIDER_SITE_OTHER): Payer: Self-pay | Admitting: Physical Medicine and Rehabilitation

## 2020-04-22 ENCOUNTER — Ambulatory Visit: Payer: Self-pay

## 2020-04-22 ENCOUNTER — Encounter: Payer: Self-pay | Admitting: Physical Medicine and Rehabilitation

## 2020-04-22 ENCOUNTER — Other Ambulatory Visit: Payer: Self-pay

## 2020-04-22 VITALS — BP 134/86 | HR 71

## 2020-04-22 DIAGNOSIS — M47816 Spondylosis without myelopathy or radiculopathy, lumbar region: Secondary | ICD-10-CM

## 2020-04-22 MED ORDER — BUPIVACAINE HCL 0.5 % IJ SOLN
3.0000 mL | Freq: Once | INTRAMUSCULAR | Status: AC
  Administered 2020-04-22: 3 mL

## 2020-04-22 MED ORDER — METHYLPREDNISOLONE ACETATE 80 MG/ML IJ SUSP
40.0000 mg | Freq: Once | INTRAMUSCULAR | Status: AC
  Administered 2020-04-22: 40 mg

## 2020-04-22 NOTE — Progress Notes (Signed)
Pt state lower back pain. Pt state walking and standing for long period of time makes the pain worse. Pt state he try to sitting or lay down to make ease the pain.  Numeric Pain Rating Scale and Functional Assessment Average Pain 6   In the last MONTH (on 0-10 scale) has pain interfered with the following?  1. General activity like being  able to carry out your everyday physical activities such as walking, climbing stairs, carrying groceries, or moving a chair?  Rating(7)   +Driver, -BT, -Dye Allergies.

## 2020-04-23 NOTE — Procedures (Signed)
Lumbar Diagnostic Facet Joint Nerve Block with Fluoroscopic Guidance   Patient: Rodney Gallagher      Date of Birth: [birth year] MRN: [id number] PCP: Tanna Furry, MD      Visit Date: 04/22/2020   Universal Protocol:    Date/Time: 09/10/215:30 AM  Consent Given By: the patient  Position: PRONE  Additional Comments: Vital signs were monitored before and after the procedure. Patient was prepped and draped in the usual sterile fashion. The correct patient, procedure, and site was verified.   Injection Procedure Details:  Procedure Site One Meds Administered:  Meds ordered this encounter  Medications  . methylPREDNISolone acetate (DEPO-MEDROL) injection 40 mg  . bupivacaine (MARCAINE) 0.5 % (with pres) injection 3 mL     Laterality: Bilateral  Location/Site:  L5-S1  Needle size: 22 ga.  Needle type:spinal  Needle Placement: Oblique pedical  Findings:   -Comments: There was excellent flow of contrast along the articular pillars without intravascular flow.  Procedure Details: The fluoroscope beam is vertically oriented in AP and then obliqued 15 to 20 degrees to the ipsilateral side of the desired nerve to achieve the "Scotty dog" appearance.  The skin over the target area of the junction of the superior articulating process and the transverse process (sacral ala if blocking the L5 dorsal rami) was locally anesthetized with a 1 ml volume of 1% Lidocaine without Epinephrine.  The spinal needle was inserted and advanced in a trajectory view down to the target.   After contact with periosteum and negative aspirate for blood and CSF, correct placement without intravascular or epidural spread was confirmed by injecting 0.5 ml. of Isovue-250.  A spot radiograph was obtained of this image.    Next, a 0.5 ml. volume of the injectate described above was injected. The needle was then redirected to the other facet joint nerves mentioned above if needed.  Prior to the  procedure, the patient was given a Pain Diary which was completed for baseline measurements.  After the procedure, the patient rated their pain every 30 minutes and will continue rating at this frequency for a total of 5 hours.  The patient has been asked to complete the Diary and return to [place] by mail, fax or hand delivered as soon as possible.   Additional Comments:  The patient tolerated the procedure well Dressing: 2 x 2 sterile gauze and Band-Aid    Post-procedure details: Patient was observed during the procedure. Post-procedure instructions were reviewed.  Patient left the clinic in stable condition.

## 2020-04-23 NOTE — Progress Notes (Signed)
Rodney Gallagher - 44 y.o. male MRN [id number]  Date of birth: September 20, 1975  Office Visit Note: Visit Date: 04/22/2020 PCP: Tanna Furry, MD Referred by: Tanna Furry  Subjective: Chief Complaint  Patient presents with  . Lower Back - Pain   HPI:  Rodney Gallagher is a 44 y.o. male who comes in today at the request of Dr. Naaman Plummer for planned Bilateral L5-S1 Lumbar facet/medial branch block with fluoroscopic guidance.  The patient has failed conservative care including home exercise, medications, time and activity modification.  This injection will be diagnostic and hopefully therapeutic.  Please see requesting physician notes for further details and justification.  Exam has shown concordant pain with facet joint loading.  Please see our prior office evaluation for this patient.  He is status post lumbar fusion down to L4-5.  He has CT scan showing facet arthropathy at L5-S1.  He has axial low back pain worse with standing and extension.  Exam is consistent with facet mediated pain.  We will complete double diagnostic medial branch blocks if successful today.  Pain diary was given.  ROS Otherwise per HPI.  Assessment & Plan: Visit Diagnoses:  1. Spondylosis without myelopathy or radiculopathy, lumbar region     Plan: No additional findings.   Meds & Orders:  Meds ordered this encounter  Medications  . methylPREDNISolone acetate (DEPO-MEDROL) injection 40 mg  . bupivacaine (MARCAINE) 0.5 % (with pres) injection 3 mL    Orders Placed This Encounter  Procedures  . Facet Injection  . XR C-ARM NO REPORT    Follow-up: Return for Review Pain Diary.   Procedures: No procedures performed  Lumbar Diagnostic Facet Joint Nerve Block with Fluoroscopic Guidance   Patient: Rodney Gallagher      Date of Birth: 09/27/1975 MRN: [id number] PCP: Tanna Furry, MD      Visit Date: 04/22/2020   Universal Protocol:    Date/Time: 09/10/215:30 AM  Consent  Given By: the patient  Position: PRONE  Additional Comments: Vital signs were monitored before and after the procedure. Patient was prepped and draped in the usual sterile fashion. The correct patient, procedure, and site was verified.   Injection Procedure Details:  Procedure Site One Meds Administered:  Meds ordered this encounter  Medications  . methylPREDNISolone acetate (DEPO-MEDROL) injection 40 mg  . bupivacaine (MARCAINE) 0.5 % (with pres) injection 3 mL     Laterality: Bilateral  Location/Site:  L5-S1  Needle size: 22 ga.  Needle type:spinal  Needle Placement: Oblique pedical  Findings:   -Comments: There was excellent flow of contrast along the articular pillars without intravascular flow.  Procedure Details: The fluoroscope beam is vertically oriented in AP and then obliqued 15 to 20 degrees to the ipsilateral side of the desired nerve to achieve the "Scotty dog" appearance.  The skin over the target area of the junction of the superior articulating process and the transverse process (sacral ala if blocking the L5 dorsal rami) was locally anesthetized with a 1 ml volume of 1% Lidocaine without Epinephrine.  The spinal needle was inserted and advanced in a trajectory view down to the target.   After contact with periosteum and negative aspirate for blood and CSF, correct placement without intravascular or epidural spread was confirmed by injecting 0.5 ml. of Isovue-250.  A spot radiograph was obtained of this image.    Next, a 0.5 ml. volume of the injectate described above was injected. The needle was then redirected to the other facet joint  nerves mentioned above if needed.  Prior to the procedure, the patient was given a Pain Diary which was completed for baseline measurements.  After the procedure, the patient rated their pain every 30 minutes and will continue rating at this frequency for a total of 5 hours.  The patient has been asked to complete the Diary and  return to [place] by mail, fax or hand delivered as soon as possible.   Additional Comments:  The patient tolerated the procedure well Dressing: 2 x 2 sterile gauze and Band-Aid    Post-procedure details: Patient was observed during the procedure. Post-procedure instructions were reviewed.  Patient left the clinic in stable condition.    Clinical History: CT LUMBAR SPINE WITHOUT CONTRAST    COMPARISON: Lumbar MRI 11/11/2019    FINDINGS:   Alignment: Preservation of the normal lumbar lordosis. No  spondylolysis or spondylolisthesis. Posterior spinal fusion L4-5  with interbody spacer placement.    Prior posterior spinal fusion and decompression at L4-5 with  posterior fusion rod secured by bilateral transpedicular fusion  screws with an interbody spacer at the L4-5 level. Fusion hardware  is intact and in expected alignment without periprosthetic lucency  of the screw tracks. There is slight subsidence of the L4 inferior  endplate approximately 3 mm with minimal change of the superior  endplate L5 as well.    Mild sclerotic discogenic endplate changes at the T12 superior  endplate with vacuum disc phenomenon. Mild arthrosis is noted at the  SI joints predominantly with sclerosis on the iliac side of the SI  articulation as well as an exuberant bony osteophyte seen along the  anterior left SI joint.     Disc levels:    Level by level evaluation of the lumbar spine below:    T11-T12: Sclerotic endplate changes, disc height loss and vacuum  disc phenomenon. No significant spinal canal stenosis or neural  foraminal narrowing.     L2-L3: Mild global disc bulge and left facet hypertrophy. No  significant spinal canal stenosis or foraminal narrowing.    L3-L4: Mild global disc bulge. Mild bilateral facet arthropathy.  Moderate bilateral neural foraminal narrowing and narrowing of the  left lateral recess. No spinal canal stenosis.    L4-L5: Operative level  with posterior canal decompressive changes  including what appears to be a partial bilateral facetectomy. No  significant canal stenosis or residual neural foraminal narrowing is  evident.    L5-S1: Mild global disc bulge. Moderate bilateral facet arthropathy.  Mild bilateral foraminal narrowing. No significant canal stenosis.    IMPRESSION:  1. Prior posterior spinal fusion and decompression at L4-5 with  interbody spacer placement. Fusion hardware is intact without  features of hardware loosening.  2. Slight subsidence of the L4 inferior endplate with minimal change  of the superior endplate L5 as well.  3. No significant canal stenosis or residual neural foraminal  narrowing at the operative level.  4. Moderate bilateral neural foraminal narrowing and narrowing of  the left lateral recess at L3-L4. Mild bilateral foraminal narrowing  L5-S1 as well.  5. Mild bilateral SI arthrosis, left greater than right.    Electronically Signed  By: Kreg Shropshire M.D.  On: 12/05/2019 20:53  --- MRI LUMBAR SPINE WITHOUT CONTRAST  FINDINGS:  Vertebrae: There are interval postoperative changes at L4-L5 including posterior decompression, pedicle screw fixation, and interbody spacer. The hardware is not well evaluated on this study and there is associated susceptibility artifact.  Conus extends to the L2 level. Conus  and cauda equina appear normal.  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 Imaged in the sagittal plane only, there is a small disc bulge or protrusion at T11-T12 also present on the prior study.  L2-L3: Minimal disc bulge. Mild facet arthropathy. No significant canal or foraminal stenosis.  L3-L4: Minimal disc bulge and mild facet arthropathy. No significant canal stenosis. Mild foraminal stenosis, left greater than right.  L4-L5: Operative level. Endplate osteophytes and facet arthropathy. The canal has been decompressed. Foraminal  stenosis is also improved.  L5-S1: Minimal disc bulge and mild facet arthropathy. No significant canal stenosis. Minor foraminal stenosis.  IMPRESSION: Operative and degenerative changes as detailed above. Canal and foramina have been decompressed at L4-L5. No new significant stenosis.  Electronically Signed   By: Guadlupe Spanish M.D.   On: 11/11/2019 09:41     Objective:  VS:  HT:    WT:   BMI:     BP:134/86  HR:71bpm  TEMP: ( )  RESP:  Physical Exam Constitutional:      General: He is not in acute distress.    Appearance: Normal appearance. He is not ill-appearing.  HENT:     Head: Normocephalic and atraumatic.     Right Ear: External ear normal.     Left Ear: External ear normal.  Eyes:     Extraocular Movements: Extraocular movements intact.  Cardiovascular:     Rate and Rhythm: Normal rate.     Pulses: Normal pulses.  Abdominal:     General: There is no distension.     Palpations: Abdomen is soft.  Musculoskeletal:        General: No tenderness or signs of injury.     Right lower leg: No edema.     Left lower leg: No edema.     Comments: Patient has good distal strength without clonus. Patient somewhat slow to rise from a seated position to full extension.  There is concordant low back pain with facet loading and lumbar spine extension rotation.  There are no definitive trigger points but the patient is somewhat tender across the lower back and PSIS.  There is no pain with hip rotation.  Skin:    Findings: No erythema or rash.  Neurological:     General: No focal deficit present.     Mental Status: He is alert and oriented to person, place, and time.     Sensory: No sensory deficit.     Motor: No weakness or abnormal muscle tone.     Coordination: Coordination normal.  Psychiatric:        Mood and Affect: Mood normal.        Behavior: Behavior normal.      Imaging: XR C-ARM NO REPORT  Result Date: 04/22/2020 Please see Notes tab for imaging  impression.

## 2020-05-13 ENCOUNTER — Telehealth: Payer: Self-pay

## 2020-05-13 NOTE — Telephone Encounter (Signed)
Called pt to inform him that if the shots didn't work then he doesn't need another inj. Pt state some issue with him urinating on himself, Dr Alvester Morin wanted him to make an follow up with a Urologist.

## 2021-01-17 ENCOUNTER — Telehealth: Payer: Self-pay | Admitting: Physical Medicine and Rehabilitation

## 2021-01-17 NOTE — Telephone Encounter (Signed)
From reading previous messages, patient was told in September 2021 to follow up with a Urologist. He states that he did see a Insurance underwriter at [employer]. I asked if he wanted to schedule an appointment. He states that he wanted you to see the reports from his appointments and labs/ imaging and let you know the symptoms after the injection he had with you.

## 2021-01-17 NOTE — Telephone Encounter (Signed)
Patient called. Would like Dr. Alvester Morin to know that he is urinating at night since injection. [phone]

## 2021-01-21 NOTE — Telephone Encounter (Signed)
I called the patient to advise as directed. He states again that he did not have any problems before the injections and that his problems began after the medial branch blocks. I advised again that there were no structures in the area where the injections were performed that control urinary function. I spoke with him at length about getting a CD of the images for him to review with his neurosurgeon at [facility]. He was unsure what neurosurgeon he had seen. He does want a copy of the images on CD, and I assured him that I would work on getting this for him. He wants this mailed to his home address, and I advised that once I have the CD I will mail it to him and that it should be sent out next week.  He wanted me to let Dr. Alvester Morin know that "we will be in contact with him."

## 2021-01-25 NOTE — Telephone Encounter (Signed)
CD mailed to address in chart.

## 2021-02-10 ENCOUNTER — Other Ambulatory Visit: Payer: Self-pay | Admitting: Neurological Surgery

## 2021-02-10 DIAGNOSIS — S32009K Unspecified fracture of unspecified lumbar vertebra, subsequent encounter for fracture with nonunion: Secondary | ICD-10-CM

## 2021-03-05 ENCOUNTER — Other Ambulatory Visit: Payer: Self-pay

## 2021-03-05 ENCOUNTER — Ambulatory Visit
Admission: RE | Admit: 2021-03-05 | Discharge: 2021-03-05 | Disposition: A | Discharge status: Home / Self Care | Payer: Self-pay | Source: Ambulatory Visit | Attending: Neurological Surgery | Admitting: Neurological Surgery

## 2021-03-05 DIAGNOSIS — S32009K Unspecified fracture of unspecified lumbar vertebra, subsequent encounter for fracture with nonunion: Secondary | ICD-10-CM

## 2021-05-28 IMAGING — CT CT L SPINE W/O CM
3 series · 12 of 33 positions shown, 14 images · non-contrast
Comparison: Lumbar MRI 11/11/2019

CLINICAL DATA: Low back pain since 8922, history of spinal fusion

EXAM:
CT LUMBAR SPINE WITHOUT CONTRAST
TECHNIQUE: Multidetector CT imaging of the lumbar spine was performed without
intravenous contrast administration. Multiplanar CT image
reconstructions were also generated.

[Series 4: l-spine 2.0 st · axial · 0.36mm/px · z∈[+28,+200]mm · 4 of 124 slices shown, 5 images]
[im 19/124  soft-tissue]
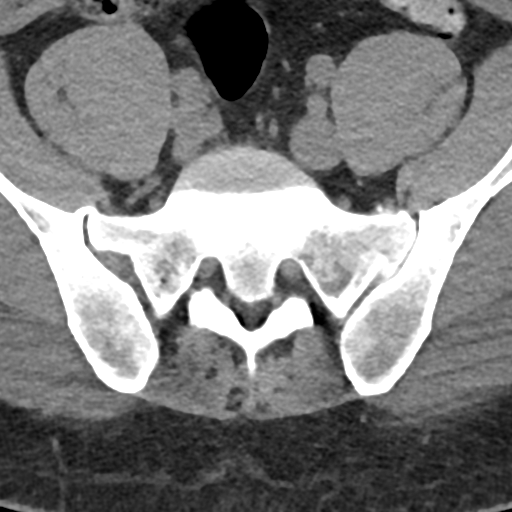
[im 19/124  bone]
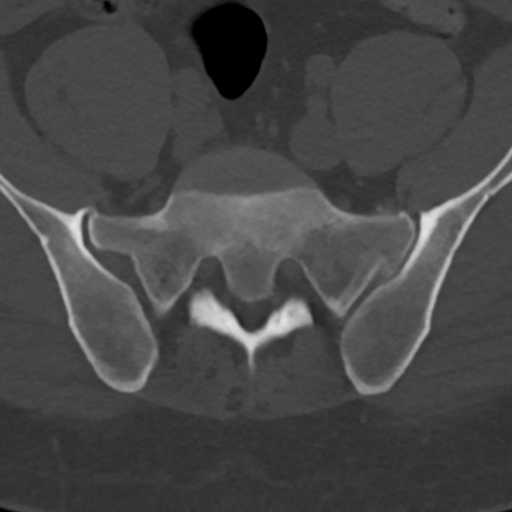
[im 48/124  bone]
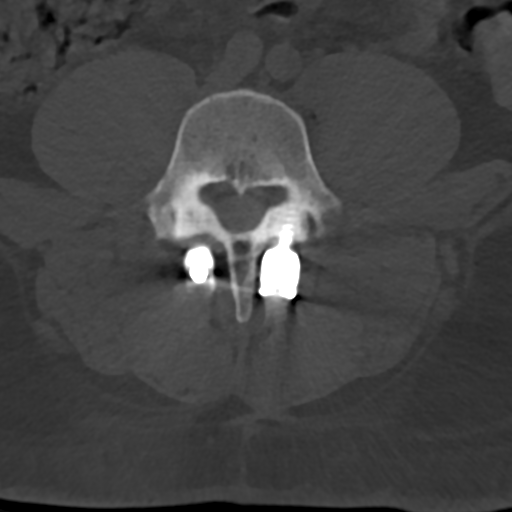
[im 76/124  bone]
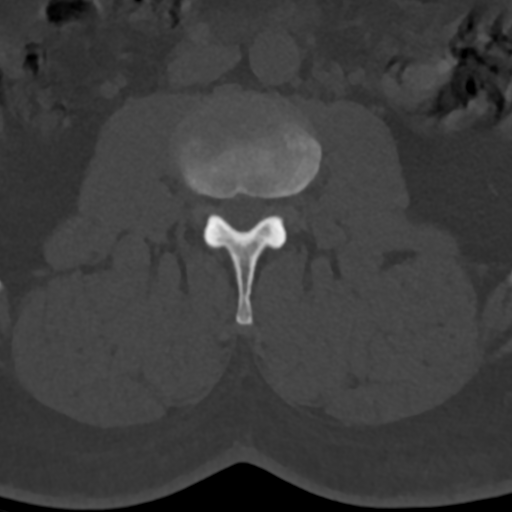
[im 105/124  bone]
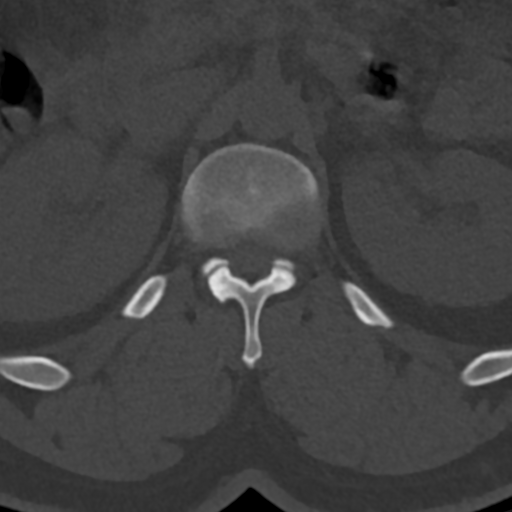

[Series 6: l-spine 2.0 cor bone · coronal · 0.31mm/px · 3 of 79 slices shown]
[im 16/79  bone]
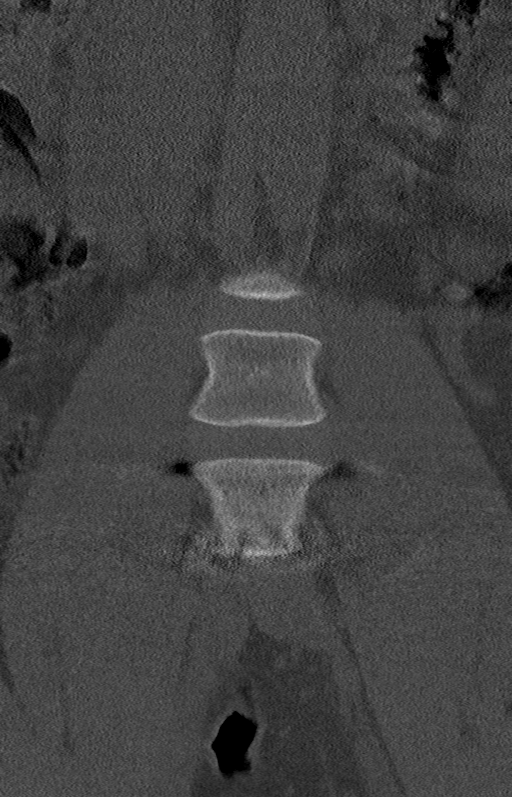
[im 32/79  bone]
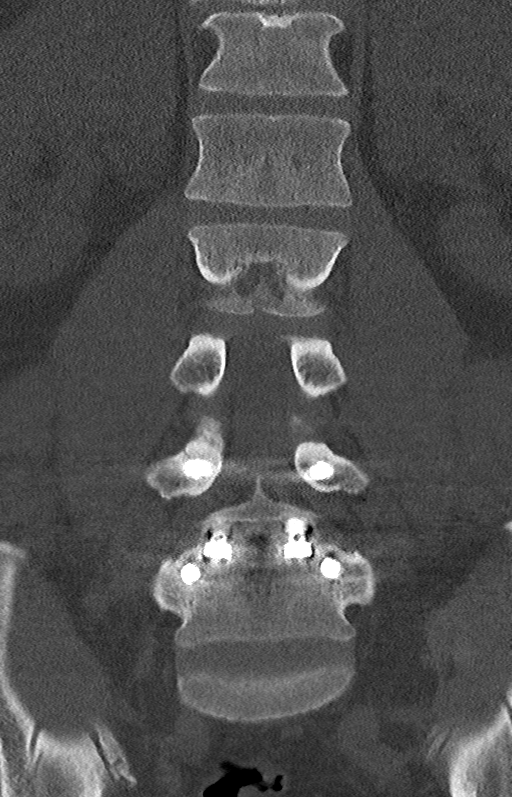
[im 47/79  bone]
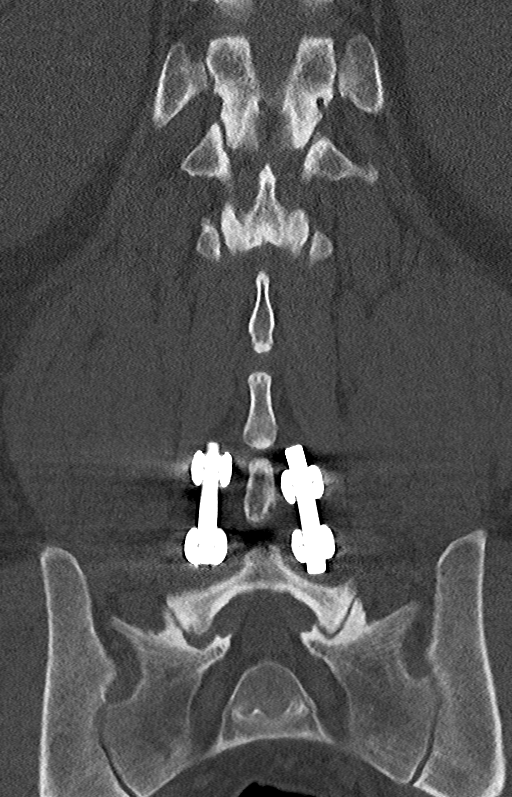

[Series 7: l-spine 2.0 sag bone · sagittal · 0.34mm/px · 5 of 70 slices shown, 6 images]
[im 24/70  bone]
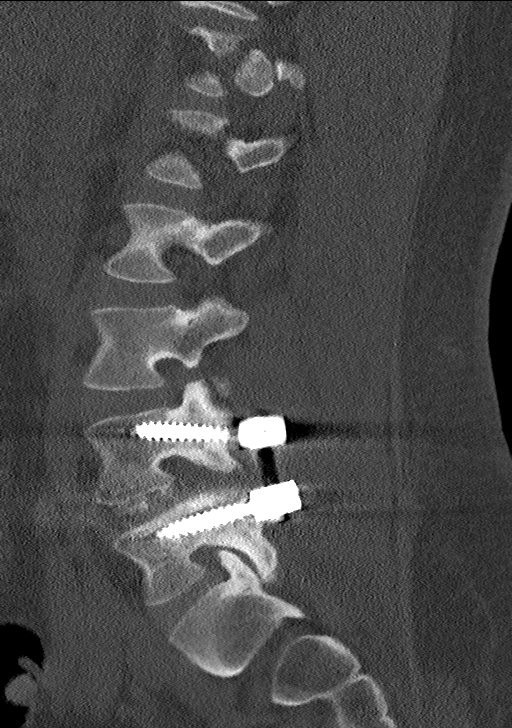
[im 29/70  bone]
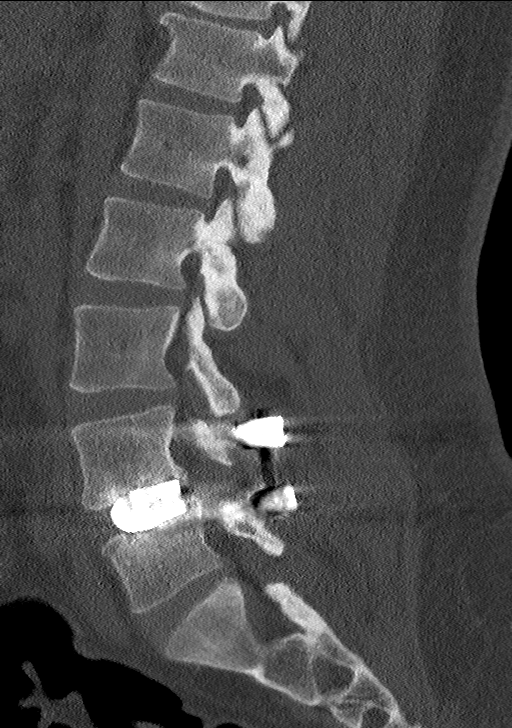
[im 35/70  soft-tissue]
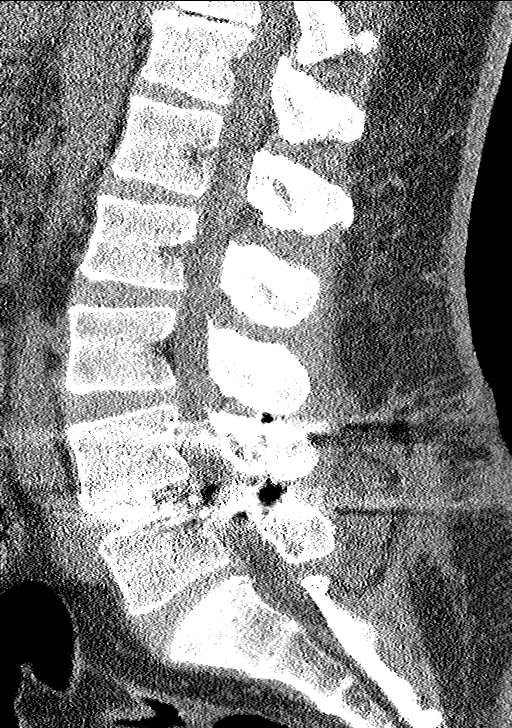
[im 35/70  bone]
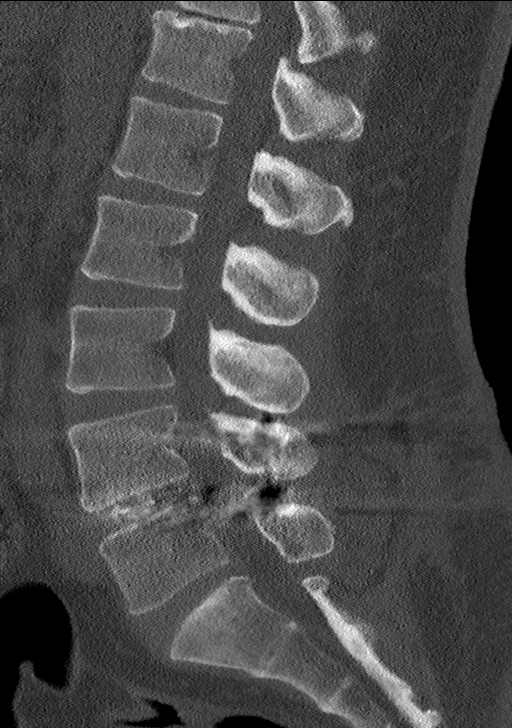
[im 41/70  bone]
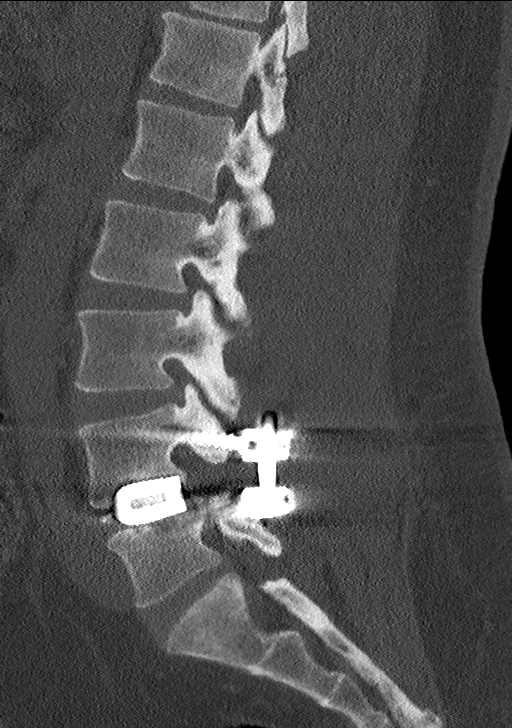
[im 47/70  bone]
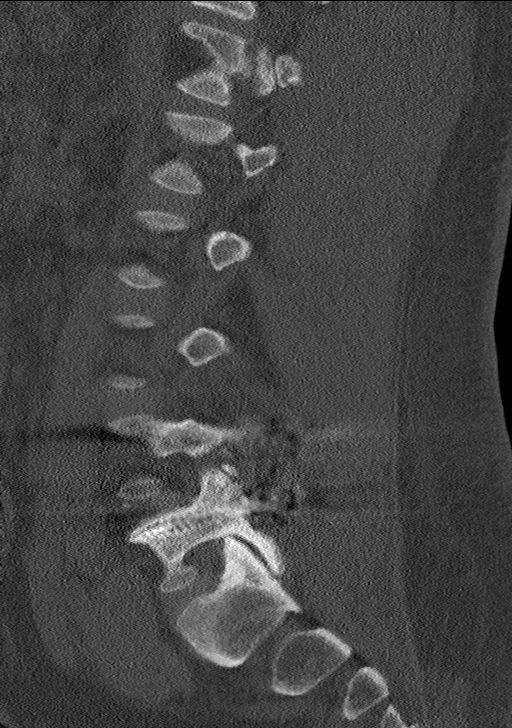

[12 of 33 positions shown; findings below may reference images not displayed]

FINDINGS: Segmentation: 5 non-rib-bearing lumbar type vertebral bodies are
noted.

Alignment: Preservation of the normal lumbar lordosis. No
spondylolysis or spondylolisthesis. Posterior spinal fusion L4-5
with interbody spacer placement.

Vertebrae: No acute fracture or vertebral body height loss.

Prior posterior spinal fusion and decompression at L4-5 with
posterior fusion rod secured by bilateral transpedicular fusion
screws with an interbody spacer at the L4-5 level. Fusion hardware
is intact and in expected alignment without periprosthetic lucency
of the screw tracks. There is slight subsidence of the L4 inferior
endplate approximately 3 mm with minimal change of the superior
endplate L5 as well.

Mild sclerotic discogenic endplate changes at the T12 superior
endplate with vacuum disc phenomenon. Mild arthrosis is noted at the
SI joints predominantly with sclerosis on the iliac side of the SI
articulation as well as an exuberant bony osteophyte seen along the
anterior left SI joint.

Paraspinal and other soft tissues: Postsurgical soft tissue changes
are noted posterior to the fusion levels. No conspicuous collection
or suspicious soft tissue abnormality is seen. No other
paravertebral fluid, swelling, gas or other acute soft tissue
abnormality is evident. Included portions of both STIR abdomen and
pelvis are unremarkable.

Disc levels:

Level by level evaluation of the lumbar spine below:

T11-T12: Sclerotic endplate changes, disc height loss and vacuum
disc phenomenon. No significant spinal canal stenosis or neural
foraminal narrowing.

T12-L1: No significant posterior disc abnormality. No significant
spinal canal stenosis or foraminal narrowing.

L1-L2: No significant posterior disc abnormality. No significant
spinal canal stenosis or foraminal narrowing.

L2-L3: Mild global disc bulge and left facet hypertrophy. No
significant spinal canal stenosis or foraminal narrowing.

L3-L4: Mild global disc bulge. Mild bilateral facet arthropathy.
Moderate bilateral neural foraminal narrowing and narrowing of the
left lateral recess. No spinal canal stenosis.

L4-L5: Operative level with posterior canal decompressive changes
including what appears to be a partial bilateral facetectomy. No
significant canal stenosis or residual neural foraminal narrowing is
evident.

L5-S1: Mild global disc bulge. Moderate bilateral facet arthropathy.
Mild bilateral foraminal narrowing. No significant canal stenosis.
IMPRESSION: 1. Prior posterior spinal fusion and decompression at L4-5 with
interbody spacer placement. Fusion hardware is intact without
features of hardware loosening.
2. Slight subsidence of the L4 inferior endplate with minimal change
of the superior endplate L5 as well.
3. No significant canal stenosis or residual neural foraminal
narrowing at the operative level.
4. Moderate bilateral neural foraminal narrowing and narrowing of
the left lateral recess at L3-L4. Mild bilateral foraminal narrowing
L5-S1 as well.
5. Mild bilateral SI arthrosis, left greater than right.

## 2021-07-19 ENCOUNTER — Ambulatory Visit (HOSPITAL_COMMUNITY): Payer: Medicare Other | Admitting: Physical Therapy

## 2021-07-20 ENCOUNTER — Ambulatory Visit (HOSPITAL_COMMUNITY): Payer: Medicare Other | Admitting: Physical Therapy

## 2021-08-31 ENCOUNTER — Other Ambulatory Visit: Payer: Self-pay

## 2021-08-31 ENCOUNTER — Ambulatory Visit (HOSPITAL_COMMUNITY): Payer: Medicare Other | Attending: Family Medicine

## 2021-08-31 DIAGNOSIS — G8929 Other chronic pain: Secondary | ICD-10-CM | POA: Diagnosis present

## 2021-08-31 DIAGNOSIS — M545 Low back pain, unspecified: Secondary | ICD-10-CM | POA: Insufficient documentation

## 2021-08-31 DIAGNOSIS — M6281 Muscle weakness (generalized): Secondary | ICD-10-CM | POA: Diagnosis present

## 2021-08-31 NOTE — Therapy (Signed)
[facility] [address] [place], [place], [address] Phone: [phone]   Fax:  [phone]  Physical Therapy Evaluation  Patient Details  Name: Rodney Gallagher MRN: [id number] Date of Birth: [birth year] Referring Provider (PT): DO Orpah Cobb   Encounter Date: 08/31/2021   PT End of Session - 08/31/21 1033     Visit Number 1    Number of Visits 4    Date for PT Re-Evaluation 10/26/21    Authorization Type [employer] Medicare    PT Start Time 1030    PT Stop Time 1115    PT Time Calculation (min) 45 min    Activity Tolerance Patient tolerated treatment well    Behavior During Therapy [facility] for tasks assessed/performed             Past Medical History:  Diagnosis Date   Arthritis    in back   Herniation of lumbar intervertebral disc    Hypertension     Past Surgical History:  Procedure Laterality Date   LUMBAR FUSION  09/18/2018   combined with laminectomy of L4-L5    There were no vitals filed for this visit.    Subjective Assessment - 08/31/21 1036     Subjective Pt notes insidious reappearance of back pain following hx of lumbar fusion no MOI noted but reports loosening of hardware and non-union.  Pt reports they will approach this non-operatively at this time. Pt has hx of PT services in the past and participation in aquatic therapy.  Pt reports non-compliance with HEP routine and notes some pain relief with use of dry needling + e-stim    How long can you sit comfortably? not as problematic    How long can you walk comfortably? walking bothers more. Can walk 10-30 minutes using walking stick    Patient Stated Goals get some relief    Currently in Pain? Yes    Pain Score 6     Pain Location Back    Pain Orientation Left    Pain Descriptors / Indicators Burning;Sharp    Pain Type Chronic pain    Aggravating Factors  walking    Pain Relieving Factors sitting more relieving                [facility] PT Assessment  - 08/31/21 0001       Assessment   Medical Diagnosis chronic LBP    Referring Provider (PT) DO Kiersten Mullis      Balance Screen   Has the patient fallen in the past 6 months Yes    How many times? 1    Has the patient had a decrease in activity level because of a fear of falling?  No    Is the patient reluctant to leave their home because of a fear of falling?  No      Prior Function   Level of Independence Independent;Independent with community mobility with device    Vocation On disability      Observation/Other Assessments   Focus on Therapeutic Outcomes (FOTO)  not loaded in system      ROM / Strength   AROM / PROM / Strength AROM;Strength      AROM   AROM Assessment Site Lumbar    Lumbar Flexion WNL    Lumbar Extension 10% limited      Strength   Strength Assessment Site Lumbar    Lumbar Flexion 2+/5    Lumbar Extension 3-/5      Palpation  Palpation comment Multifidus lift test (-) left side      Ambulation/Gait   Ambulation/Gait Yes    Ambulation/Gait Assistance 6: Modified independent (Device/Increase time)                        Objective measurements completed on examination: See above findings.       [facility] - 08/31/21 0001       Exercises   Exercises Lumbar      Lumbar Exercises: Quadruped   Single Arm Raise Right;Left;5 reps    Straight Leg Raise 10 reps                     PT Education - 08/31/21 1357     Education Details education regarding passive modalities vs active muscular engagement. Education/demonstration of lumbar anatomy and exam findings    Person(s) Educated Patient    Methods Explanation    Comprehension Verbalized understanding;Need further instruction              PT Short Term Goals - 08/31/21 1412       PT SHORT TERM GOAL #1   Title pt to be I with inital HEP    Time 3    Period Weeks    Status New    Target Date 09/21/21      PT SHORT TERM GOAL #2    Title pt to verbalize/ demo efficient posture and lifting mechanics to reduce and prevent low back pain    Time 3    Period Weeks    Status New    Target Date 09/21/21               PT Long Term Goals - 08/31/21 1412       PT LONG TERM GOAL #1   Title Demo trunk flexion and extension 3/5 to facilitate proximal stability    Baseline 2+/5 trunk flexion; 3-/5 trunk extension    Time 8    Period Weeks    Status New    Target Date 10/26/21      PT LONG TERM GOAL #2   Title Report ability to stand/ambulate x 60 min to improve activity tolerance    Baseline self-report 10-30 min standing/walking tolerance    Time 8    Period Weeks    Status New    Target Date 10/26/21                    Plan - 08/31/21 1359     Clinical Impression Statement Pt is 46 yo male with hx of failed lumbar fusion and presents with chronic left-side LBP and activity limitations related to chronic pain and manifests trunk weakness, limited lumbar extension, reduced activity tolerance.  Pt notes difficult home circumstances limiting his participation to every other week. PT services indicated to improve upon these deficits to improve gait tolerance    Personal Factors and Comorbidities Behavior Pattern;Social Background;Time since onset of injury/illness/exacerbation    Examination-Activity Limitations Stand;Locomotion Level    Examination-Participation Restrictions Yard Work;Cleaning    Stability/Clinical Decision Making Stable/Uncomplicated    Clinical Decision Making Low    Rehab Potential Poor    PT Frequency Biweekly    PT Duration --   for 4 visits   PT Treatment/Interventions ADLs/Self Care Home Management;Electrical Stimulation;DME Instruction;Gait training;Functional mobility training;Therapeutic activities;Therapeutic exercise;Balance training;Neuromuscular re-education;Dry needling;Taping;Patient/family education    PT Next Visit Plan pt would like to try dry  needling per his  report of past pain relief    PT Home Exercise Plan Bird-dog progression    Consulted and Agree with Plan of Care Patient             Patient will benefit from skilled therapeutic intervention in order to improve the following deficits and impairments:  Decreased activity tolerance, Decreased endurance, Decreased mobility, Decreased range of motion, Difficulty walking, Impaired perceived functional ability, Pain, Decreased strength  Visit Diagnosis: Chronic bilateral low back pain, unspecified whether sciatica present  Muscle weakness (generalized)     Problem List Patient Active Problem List   Diagnosis Date Noted   S/P lumbar fusion 09/18/2018    Dion Body, PT 08/31/2021, 2:15 PM  [place] [address] [place], [place], [address] Phone: [phone]   Fax:  [phone]  Name: Rodney Gallagher MRN: [id number] Date of Birth: [birth year]

## 2021-09-07 ENCOUNTER — Encounter (HOSPITAL_COMMUNITY): Payer: Medicare Other | Admitting: Physical Therapy

## 2021-09-15 ENCOUNTER — Other Ambulatory Visit: Payer: Self-pay

## 2021-09-15 ENCOUNTER — Encounter (HOSPITAL_COMMUNITY): Payer: Self-pay | Admitting: Physical Therapy

## 2021-09-15 ENCOUNTER — Ambulatory Visit (HOSPITAL_COMMUNITY): Payer: Medicare Other | Attending: Family Medicine | Admitting: Physical Therapy

## 2021-09-15 DIAGNOSIS — M6281 Muscle weakness (generalized): Secondary | ICD-10-CM | POA: Diagnosis present

## 2021-09-15 DIAGNOSIS — M545 Low back pain, unspecified: Secondary | ICD-10-CM | POA: Insufficient documentation

## 2021-09-15 DIAGNOSIS — G8929 Other chronic pain: Secondary | ICD-10-CM | POA: Diagnosis present

## 2021-09-15 DIAGNOSIS — M25512 Pain in left shoulder: Secondary | ICD-10-CM | POA: Diagnosis present

## 2021-09-15 DIAGNOSIS — R293 Abnormal posture: Secondary | ICD-10-CM | POA: Diagnosis present

## 2021-09-15 NOTE — Patient Instructions (Signed)
Access Code: [email or web address] URL: [email or web address] Date: 09/15/2021 Prepared by: [facility] Sefora Tietje  Exercises Shoulder External Rotation and Scapular Retraction with Resistance - 1 x daily - 7 x weekly - 3 sets - 10 reps Standing Shoulder Horizontal Abduction with Resistance - 1 x daily - 7 x weekly - 3 sets - 10 reps Standing Shoulder Single Arm PNF D2 Flexion with Resistance (Mirrored) - 1 x daily - 7 x weekly - 3 sets - 10 reps

## 2021-09-15 NOTE — Therapy (Signed)
[place], [place], [address] Phone: [phone]   Fax:  [phone]  Physical Therapy Treatment/Recert  Patient Details  Name: Rodney Gallagher MRN: [id number] Date of Birth: 12/25/1975 Referring Provider (PT): DO Mina Marble   Encounter Date: 09/15/2021   PT End of Session - 09/15/21 1017     Visit Number 2    Number of Visits 4    Date for PT Re-Evaluation 10/26/21    Authorization Type [employer] Medicare    PT Start Time 1015   arrives late/delayed check in   PT Stop Time 1104    PT Time Calculation (min) 49 min    Activity Tolerance Patient tolerated treatment well    Behavior During Therapy [facility] for tasks assessed/performed             Past Medical History:  Diagnosis Date   Arthritis    in back   Herniation of lumbar intervertebral disc    Hypertension     Past Surgical History:  Procedure Laterality Date   LUMBAR FUSION  09/18/2018   combined with laminectomy of L4-L5    There were no vitals filed for this visit.   Subjective Assessment - 09/15/21 1017     Subjective Back in 2014 he had in issue with his shoulder. He had a shot in his shoulder at this time. It mostly bothers him with repetitive tasks. Has sharp pain in front of shoulder sometimes. Back is about the same. Hasn't done HEP and hasnt felt like. Has had alot going on at home.    How long can you sit comfortably? not as problematic    How long can you walk comfortably? walking bothers more. Can walk 10-30 minutes using walking stick    Patient Stated Goals get some relief    Currently in Pain? Yes    Pain Score 6     Pain Location Back    Pain Orientation Lower    Pain Descriptors / Indicators Throbbing;Sharp    Pain Type Chronic pain    Pain Onset More than a month ago    Pain Frequency Constant                [facility] PT Assessment - 09/15/21 0001       Assessment   Medical Diagnosis chronic LBP/ L shoulder     Referring Provider (PT) DO Kiersten Mullis      Balance Screen   Has the patient fallen in the past 6 months Yes    How many times? 1    Has the patient had a decrease in activity level because of a fear of falling?  No    Is the patient reluctant to leave their home because of a fear of falling?  No      Prior Function   Level of Independence Independent;Independent with community mobility with device    Vocation On disability      Observation/Other Assessments   Observations ambulates with cane      AROM   AROM Assessment Site Shoulder    Right/Left Shoulder Right;Left    Right Shoulder Internal Rotation --   T4   Right Shoulder External Rotation --   T7   Left Shoulder Flexion 165 Degrees    Left Shoulder ABduction 160 Degrees    Left Shoulder Internal Rotation --   right scapular spine   Left Shoulder External Rotation --   T8     Strength  Strength Assessment Site Shoulder    Right/Left Shoulder Right;Left    Right Shoulder Flexion 5/5    Right Shoulder ABduction 5/5    Left Shoulder Flexion 4/5    Left Shoulder ABduction 4+/5    Left Shoulder Internal Rotation 4+/5    Left Shoulder External Rotation 4/5                           [facility] Adult PT Treatment/Exercise - 09/15/21 0001       Lumbar Exercises: Standing   Other Standing Lumbar Exercises scap ret with [facility] 3x 10 green band, horizontal abduction 3x 10 green band, PNF d2 2x 10 bilateral green band                     PT Education - 09/15/21 1017     Education Details HEP, reassessment findings, POC, HEP, shoulder anatomy and pathology, joining gym, exercise effects on mood and well being    Person(s) Educated Patient    Methods Explanation;Demonstration;Handout    Comprehension Verbalized understanding;Returned demonstration              PT Short Term Goals - 09/15/21 1116       PT SHORT TERM GOAL #1   Title pt to be I with inital HEP    Time 3    Period Weeks     Status On-going    Target Date 09/21/21      PT SHORT TERM GOAL #2   Title pt to verbalize/ demo efficient posture and lifting mechanics to reduce and prevent low back pain    Time 3    Period Weeks    Status On-going    Target Date 09/21/21               PT Long Term Goals - 09/15/21 1113       PT LONG TERM GOAL #1   Title Demo trunk flexion and extension 3/5 to facilitate proximal stability    Baseline 2+/5 trunk flexion; 3-/5 trunk extension    Time 8    Period Weeks    Status On-going    Target Date 10/26/21      PT LONG TERM GOAL #2   Title Report ability to stand/ambulate x 60 min to improve activity tolerance    Baseline self-report 10-30 min standing/walking tolerance    Time 8    Period Weeks    Status On-going    Target Date 10/26/21      PT LONG TERM GOAL #3   Title Patient will demonstrate L shoulder MMT 5/5 for improved ability to complete tasks at home.    Status New    Target Date 10/26/21                   Plan - 09/15/21 1017     Clinical Impression Statement Patient with c/o L shoulder pain with limitation in strength likely contributing to symptoms. Adding shoulder goals to POC to improve patients symptoms. Patient requires intermittent cueing for mechanics of exercises with good carry over. Educated patient as seen in education section. Patient will continue to benefit from skilled physical therapy in order to reduce impairment and improve function.    Personal Factors and Comorbidities Behavior Pattern;Social Background;Time since onset of injury/illness/exacerbation    Examination-Activity Limitations Stand;Locomotion Level    Examination-Participation Restrictions Yard Work;Cleaning    Stability/Clinical Decision Making Stable/Uncomplicated    Rehab  Potential Poor    PT Frequency Biweekly    PT Duration --   for 4 visits   PT Treatment/Interventions ADLs/Self Care Home Management;Electrical Stimulation;DME Instruction;Gait  training;Functional mobility training;Therapeutic activities;Therapeutic exercise;Balance training;Neuromuscular re-education;Dry needling;Taping;Patient/family education    PT Next Visit Plan pt would like to try dry needling per his report of past pain relief; f/u with shoulder exercises on symptoms, pick up with lumbar interventions    PT Home Exercise Plan Bird-dog progression 2/2 scap ret with [facility] ER, horizontal abd, PNF D2    Consulted and Agree with Plan of Care Patient             Patient will benefit from skilled therapeutic intervention in order to improve the following deficits and impairments:  Decreased activity tolerance, Decreased endurance, Decreased mobility, Decreased range of motion, Difficulty walking, Impaired perceived functional ability, Pain, Decreased strength  Visit Diagnosis: Chronic bilateral low back pain, unspecified whether sciatica present  Muscle weakness (generalized)  Abnormal posture  Left shoulder pain, unspecified chronicity     Problem List Patient Active Problem List   Diagnosis Date Noted   S/P lumbar fusion 09/18/2018    11:18 AM, 09/15/21 Mearl Latin PT, DPT Physical Therapist at [facility] [address] [place], [place], [address] Phone: [phone]   Fax:  [phone]  Name: Buddie Marston MRN: [id number] Date of Birth: 1976-08-12

## 2021-09-19 ENCOUNTER — Encounter (HOSPITAL_COMMUNITY): Payer: Medicare Other | Admitting: Physical Therapy

## 2021-09-22 ENCOUNTER — Encounter (HOSPITAL_COMMUNITY): Payer: Medicare Other | Admitting: Physical Therapy

## 2021-09-26 ENCOUNTER — Encounter (HOSPITAL_COMMUNITY): Payer: Medicare Other | Admitting: Physical Therapy

## 2021-09-29 ENCOUNTER — Other Ambulatory Visit: Payer: Self-pay

## 2021-09-29 ENCOUNTER — Ambulatory Visit (HOSPITAL_COMMUNITY): Payer: Medicare Other | Admitting: Physical Therapy

## 2021-09-29 ENCOUNTER — Encounter (HOSPITAL_COMMUNITY): Payer: Self-pay | Admitting: Physical Therapy

## 2021-09-29 DIAGNOSIS — M545 Low back pain, unspecified: Secondary | ICD-10-CM | POA: Diagnosis not present

## 2021-09-29 DIAGNOSIS — M6281 Muscle weakness (generalized): Secondary | ICD-10-CM

## 2021-09-29 NOTE — Patient Instructions (Signed)
Access Code: [email or web address] URL: [email or web address] Date: 09/29/2021 Prepared by: Georges Lynch  Exercises Supine Transversus Abdominis Bracing - Hands on Stomach - 2 x daily - 7 x weekly - 2 sets - 10 reps - 5 second hold Supine March - 2 x daily - 7 x weekly - 2 sets - 10 reps Supine Dead Bug with Leg Extension - 2 x daily - 7 x weekly - 2 sets - 10 reps

## 2021-09-29 NOTE — Therapy (Signed)
[place], [place], [address] Phone: [phone]   Fax:  [phone]  Physical Therapy Treatment  Patient Details  Name: Rodney Gallagher MRN: [id number] Date of Birth: 07/08/1976 Referring Provider (PT): DO Mina Marble   Encounter Date: 09/29/2021   PT End of Session - 09/29/21 0957     Visit Number 3    Number of Visits 4    Date for PT Re-Evaluation 10/26/21    Authorization Type UHC Medicare    PT Start Time 0952    PT Stop Time 1033    PT Time Calculation (min) 41 min    Activity Tolerance Patient tolerated treatment well    Behavior During Therapy [facility] for tasks assessed/performed             Past Medical History:  Diagnosis Date   Arthritis    in back   Herniation of lumbar intervertebral disc    Hypertension     Past Surgical History:  Procedure Laterality Date   LUMBAR FUSION  09/18/2018   combined with laminectomy of L4-L5    There were no vitals filed for this visit.   Subjective Assessment - 09/29/21 0955     Subjective Patient reports ongoing back pain. Home exercise is somewhat inconsistent. About a 6 today.    How long can you sit comfortably? not as problematic    How long can you walk comfortably? walking bothers more. Can walk 10-30 minutes using walking stick    Patient Stated Goals get some relief    Currently in Pain? Yes    Pain Score 6     Pain Location Back    Pain Orientation Posterior;Lower    Pain Descriptors / Indicators Sharp    Pain Type Chronic pain    Pain Onset More than a month ago                               [facility] Adult PT Treatment/Exercise - 09/29/21 0001       Lumbar Exercises: Supine   Ab Set 10 reps;5 seconds    Bent Knee Raise 20 reps    Dead Bug 20 reps      Modalities   Modalities Teacher, English as a foreign language Location lumbar paraspinals    Electrical Stimulation  Action IFC for pain    Electrical Stimulation Parameters IFC default setting 8 min, 19.5 mA                       PT Short Term Goals - 09/15/21 1116       PT SHORT TERM GOAL #1   Title pt to be I with inital HEP    Time 3    Period Weeks    Status On-going    Target Date 09/21/21      PT SHORT TERM GOAL #2   Title pt to verbalize/ demo efficient posture and lifting mechanics to reduce and prevent low back pain    Time 3    Period Weeks    Status On-going    Target Date 09/21/21               PT Long Term Goals - 09/15/21 1113       PT LONG TERM GOAL #1   Title Demo trunk flexion and extension 3/5 to facilitate proximal stability  Baseline 2+/5 trunk flexion; 3-/5 trunk extension    Time 8    Period Weeks    Status On-going    Target Date 10/26/21      PT LONG TERM GOAL #2   Title Report ability to stand/ambulate x 60 min to improve activity tolerance    Baseline self-report 10-30 min standing/walking tolerance    Time 8    Period Weeks    Status On-going    Target Date 10/26/21      PT LONG TERM GOAL #3   Title Patient will demonstrate L shoulder MMT 5/5 for improved ability to complete tasks at home.    Status New    Target Date 10/26/21                   Plan - 09/29/21 1029     Clinical Impression Statement Progressed core strengthening. Educated patient on anatomy of core muscles and importance related to lumbar pain. Educated patient on proper form and function of added exercises. Introduced IFC stim for pain reduction. Patient tolerated well noting improved symptoms following. Patient issued updated HEP handout. Patient will continue to benefit from skilled therapy services to reduce deficits and improve functional level.    Personal Factors and Comorbidities Behavior Pattern;Social Background;Time since onset of injury/illness/exacerbation    Examination-Activity Limitations Stand;Locomotion Level    Examination-Participation  Restrictions Yard Work;Cleaning    Stability/Clinical Decision Making Stable/Uncomplicated    Rehab Potential Poor    PT Frequency Biweekly    PT Duration --   for 4 visits   PT Treatment/Interventions ADLs/Self Care Home Management;Electrical Stimulation;DME Instruction;Gait training;Functional mobility training;Therapeutic activities;Therapeutic exercise;Balance training;Neuromuscular re-education;Dry needling;Taping;Patient/family education    PT Next Visit Plan pt would like to try dry needling per his report of past pain relief; f/u with shoulder exercises on symptoms, pick up with lumbar interventions    PT Home Exercise Plan Bird-dog progression 2/2 scap ret with [facility] ER, horizontal abd, PNF D2 2/16 ab brace, ab march, deadbug    Consulted and Agree with Plan of Care Patient             Patient will benefit from skilled therapeutic intervention in order to improve the following deficits and impairments:  Decreased activity tolerance, Decreased endurance, Decreased mobility, Decreased range of motion, Difficulty walking, Impaired perceived functional ability, Pain, Decreased strength  Visit Diagnosis: Chronic bilateral low back pain, unspecified whether sciatica present  Muscle weakness (generalized)     Problem List Patient Active Problem List   Diagnosis Date Noted   S/P lumbar fusion 09/18/2018   10:36 AM, 09/29/21 Josue Hector PT DPT  Physical Therapist with [facility]  ([phone] [place], [place], [address] Phone: [phone]   Fax:  [phone]  Name: Rodney Gallagher MRN: [id number] Date of Birth: [birth year]

## 2021-10-03 ENCOUNTER — Encounter (HOSPITAL_COMMUNITY): Payer: Medicare Other | Admitting: Physical Therapy

## 2021-10-06 ENCOUNTER — Encounter (HOSPITAL_COMMUNITY): Payer: Medicare Other | Admitting: Physical Therapy

## 2021-10-10 ENCOUNTER — Encounter (HOSPITAL_COMMUNITY): Payer: Medicare Other | Admitting: Physical Therapy

## 2021-10-13 ENCOUNTER — Ambulatory Visit (HOSPITAL_COMMUNITY): Payer: Medicare Other | Admitting: Physical Therapy

## 2021-12-13 ENCOUNTER — Ambulatory Visit (HOSPITAL_COMMUNITY): Payer: Medicare Other | Attending: Family Medicine | Admitting: Physical Therapy

## 2021-12-13 ENCOUNTER — Encounter (HOSPITAL_COMMUNITY): Payer: Self-pay | Admitting: Physical Therapy

## 2021-12-13 DIAGNOSIS — R293 Abnormal posture: Secondary | ICD-10-CM | POA: Diagnosis present

## 2021-12-13 DIAGNOSIS — M545 Low back pain, unspecified: Secondary | ICD-10-CM | POA: Insufficient documentation

## 2021-12-13 DIAGNOSIS — G8929 Other chronic pain: Secondary | ICD-10-CM | POA: Diagnosis present

## 2021-12-13 DIAGNOSIS — M6281 Muscle weakness (generalized): Secondary | ICD-10-CM | POA: Insufficient documentation

## 2021-12-13 DIAGNOSIS — M25512 Pain in left shoulder: Secondary | ICD-10-CM | POA: Diagnosis present

## 2021-12-13 NOTE — Therapy (Signed)
?OUTPATIENT PHYSICAL THERAPY TREATMENT NOTE/ Recert ? ?Progress Note  ? ?Reporting Period 08/31/21 to 12/13/21 ? ? See note below for Objective Data and Assessment of Progress/Goals  ? ? ?Patient Name: Rodney Gallagher ?MRN: [id number] ?DOB:April 04, 1976, 46 y.o., male ?Today's Date: 12/13/2021 ? ?PCP: Mina Marble DO ?REFERRING PROVIDER:  Mina Marble DO ? ? PT End of Session - 12/13/21 1000   ? ? Visit Number 4   ? Number of Visits 8   ? Date for PT Re-Evaluation 01/10/22   ? Authorization Type [employer]HC Medicare   ? PT Start Time 1000   ? PT Stop Time 1038   ? PT Time Calculation (min) 38 min   ? Activity Tolerance Patient tolerated treatment well   ? Behavior During Therapy [facility] for tasks assessed/performed   ? ?  ?  ? ?  ? ? ?Past Medical History:  ?Diagnosis Date  ? Arthritis   ? in back  ? Herniation of lumbar intervertebral disc   ? Hypertension   ? ?Past Surgical History:  ?Procedure Laterality Date  ? LUMBAR FUSION  09/18/2018  ? combined with laminectomy of L4-L5  ? ?Patient Active Problem List  ? Diagnosis Date Noted  ? S/P lumbar fusion 09/18/2018  ? ? ?REFERRING DIAG: Chronic LBP with functional impariment and G89.29 Other Chronic pain  ? ?THERAPY DIAG:  ?Chronic bilateral low back pain, unspecified whether sciatica present ? ?Muscle weakness (generalized) ? ?Abnormal posture ? ?Left shoulder pain, unspecified chronicity ? ?PERTINENT HISTORY: Chronic pain, hx of lumbar fusion ? ?PRECAUTIONS: none ? ?SUBJECTIVE: Trying to get case admitted to pain clinic to get more interventions done there. Has been going to other doctor appointment. Has been doing the shoulder exercises with the bands and its a lot better. No issues with shoulder. Back is still bothering him. Shoulder is 100% improved since beginning therapy. Back is 0% improved since beginning therapy. 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 Really liked the estim and wants that. Insurance wants him to do therapy before injections.  ? ?PAIN:  ?Are you  having pain? Yes: NPRS scale: 6/10 ?Pain location: low back ?Pain description: stabbing, aching, dull ?Aggravating factors: movement ?Relieving factors: laying ? ? ?Objective: ? [facility] PT Assessment   ? ?  ? Assessment  ? Medical Diagnosis chronic LBP/ L shoulder   ? Referring Provider (PT) DO Mina Marble   ?  ? Balance Screen  ? Has the patient fallen in the past 6 months Yes   ? How many times? 1   ? Has the patient had a decrease in activity level because of a fear of falling?  No   ? Is the patient reluctant to leave their home because of a fear of falling?  No   ?  ? Prior Function  ? Level of Independence Independent;Independent with community mobility with device   ? Vocation On disability   ?  ? Observation/Other Assessments  ? Observations ambulates with cane   ?  ? AROM  ? Overall AROM Comments shoulder WFL   ? Lumbar Flexion 0% limited   ? Lumbar Extension 10% limited   ? Lumbar - Right Side Bend 0% limited   ? Lumbar - Left Side Bend 0% limited   ?  ? Strength  ? Right Shoulder Flexion 5/5   ? Right Shoulder ABduction 5/5   ? Left Shoulder Flexion 5/5   ? Left Shoulder ABduction 5/5   ? Left Shoulder Internal Rotation 5/5   ?  Left Shoulder External Rotation 5/5   ? ?  ?  ? ?  ? ? ? ?TODAY'S TREATMENT:  ?12/13/21 ?Hooklying glute set 5x 5 second holds ?Bridge 2x 10  ?SLR with ab set 2x 10 bilateral  ? ? ?PATIENT EDUCATION: ?Education details: HEP, reassessment findings, POC ?Person educated: Patient ?Education method: Explanation and Demonstration ?Education comprehension: verbalized understanding and returned demonstration ? ? ?HOME EXERCISE PROGRAM: ?Bird-dog progression 2/2 scap ret with [facility] ER, horizontal abd, PNF D2 2/16 ab brace, ab march, deadbug  ?Access Code: [email or web address] ?Date: 12/13/2021 ?- Supine Bridge  - 1 x daily - 7 x weekly - 2 sets - 10 reps ?- Supine Straight Leg Raises  - 1 x daily - 7 x weekly - 2 sets - 10 reps ? ? PT Short Term Goals -   ? ?  ? PT SHORT TERM GOAL #1  ? Title pt to be I  with inital HEP   ? Time 3   ? Period Weeks   ? Status MET  ? Target Date 09/21/21   ?  ? PT SHORT TERM GOAL #2  ? Title pt to verbalize/ demo efficient posture and lifting mechanics to reduce and prevent low back pain   ? Time 3   ? Period Weeks   ? Status On-going   ? Target Date 09/21/21   ? ?  ?  ? ?  ? ? ? PT Long Term Goals -   ? ?  ? PT LONG TERM GOAL #1  ? Title Demo trunk flexion and extension 3/5 to facilitate proximal stability   ? Baseline 2+/5 trunk flexion; 3-/5 trunk extension   ? Time 8   ? Period Weeks   ? Status On-going   ? Target Date 10/26/21   ?  ? PT LONG TERM GOAL #2  ? Title Report ability to stand/ambulate x 60 min to improve activity tolerance   ? Baseline self-report 20-35 minutes - pushing it with 40 minutes  ? Time 8   ? Period Weeks   ? Status On-going   ? Target Date 10/26/21   ?  ? PT LONG TERM GOAL #3  ? Title Patient will demonstrate L shoulder MMT 5/5 for improved ability to complete tasks at home.   ? Status MET  ? Target Date 10/26/21   ? ?  ?  ? ?  ? ? ? Plan -  ? ? Clinical Impression Statement Patient 1/2 short term goals and  1/3 long term goals with ability to complete HEP and improved shoulder symptoms. Patient continues to remain limited by chronic LBP. Patient educated on chronic pain and exercise progression. Continued with core strength. Will extend POC to continue to progress core strength and general fitness for 4 weeks. Patient with previous decrease in symptoms with estim, will show home units online next session. Patient will continue to benefit from physical therapy in order to improve function and reduce impairment. ?  ? Personal Factors and Comorbidities Behavior Pattern;Social Background;Time since onset of injury/illness/exacerbation   ? Examination-Activity Limitations Stand;Locomotion Level   ? Examination-Participation Restrictions Yard Work;Cleaning   ? Stability/Clinical Decision Making Stable/Uncomplicated   ? Rehab Potential Poor   ? PT Frequency  1x/week  ? PT Duration --   for 4 visits  ? PT Treatment/Interventions ADLs/Self Care Home Management;Electrical Stimulation;DME Instruction;Gait training;Functional mobility training;Therapeutic activities;Therapeutic exercise;Balance training;Neuromuscular re-education;Dry needling;Taping;Patient/family education   ? PT Next Visit Plan pt would like to try dry needling per his  report of past pain relief; f/u with shoulder exercises on symptoms, pick up with lumbar interventions; estim with exercise and show home units  ? PT Home Exercise Plan Bird-dog progression 2/2 scap ret with [facility] ER, horizontal abd, PNF D2 2/16 ab brace, ab march, deadbug 5/2 SLR, bridge  ? Consulted and Agree with Plan of Care Patient   ? ?  ?  ? ?  ? ? ? ? ?Mearl Latin, PT ?12/13/2021, 10:31 AM ? ?   ?

## 2021-12-20 ENCOUNTER — Ambulatory Visit (HOSPITAL_COMMUNITY): Payer: Medicare Other | Admitting: Physical Therapy

## 2021-12-20 DIAGNOSIS — R293 Abnormal posture: Secondary | ICD-10-CM

## 2021-12-20 DIAGNOSIS — M6281 Muscle weakness (generalized): Secondary | ICD-10-CM

## 2021-12-20 DIAGNOSIS — G8929 Other chronic pain: Secondary | ICD-10-CM

## 2021-12-20 DIAGNOSIS — M545 Low back pain, unspecified: Secondary | ICD-10-CM | POA: Diagnosis not present

## 2021-12-20 NOTE — Therapy (Signed)
?OUTPATIENT PHYSICAL THERAPY TREATMENT NOTE/ Recert ? ?Patient Name: Rodney Gallagher ?MRN: [id number] ?DOB:18-Nov-1975, 46 y.o., male ?Today's Date: 12/20/2021 ? ?PCP: Mina Marble DO ?REFERRING PROVIDER:  Mina Marble DO ? ? PT End of Session - 12/20/21 1159   ? ? Visit Number 5   ? Number of Visits 8   ? Date for PT Re-Evaluation 01/10/22   ? Authorization Type UHC Medicare   ? PT Start Time 1145   ? PT Stop Time 8978   ? PT Time Calculation (min) 30 min   ? Activity Tolerance Patient tolerated treatment well   ? Behavior During Therapy [facility] for tasks assessed/performed   ? ?  ?  ? ?  ? ? ?Past Medical History:  ?Diagnosis Date  ? Arthritis   ? in back  ? Herniation of lumbar intervertebral disc   ? Hypertension   ? ?Past Surgical History:  ?Procedure Laterality Date  ? LUMBAR FUSION  09/18/2018  ? combined with laminectomy of L4-L5  ? ?Patient Active Problem List  ? Diagnosis Date Noted  ? S/P lumbar fusion 09/18/2018  ? ? ?REFERRING DIAG: Chronic LBP with functional impariment and G89.29 Other Chronic pain  ? ?THERAPY DIAG:  ?Chronic bilateral low back pain, unspecified whether sciatica present ? ?Muscle weakness (generalized) ? ?Abnormal posture ? ?PERTINENT HISTORY: Chronic pain, hx of lumbar fusion ? ?PRECAUTIONS: none ? ?SUBJECTIVE: Reports he's going to [facility] for injections hopefully.  Reports his back is about the same at 6/10.  Pt with questions regarding TENS unit to purchase.  ? ?PAIN:  ?Are you having pain? Yes: NPRS scale: 6/10 ?Pain location: low back ?Pain description: stabbing, aching, dull ?Aggravating factors: movement ?Relieving factors: laying ? ? ?Objective: ? [facility] PT Assessment   ? ?  ? Assessment  ? Medical Diagnosis chronic LBP/ L shoulder   ? Referring Provider (PT) DO Mina Marble   ?  ? Balance Screen  ? Has the patient fallen in the past 6 months Yes   ? How many times? 1   ? Has the patient had a decrease in activity level because of a fear of falling?  No   ? Is the  patient reluctant to leave their home because of a fear of falling?  No   ?  ? Prior Function  ? Level of Independence Independent;Independent with community mobility with device   ? Vocation On disability   ?  ? Observation/Other Assessments  ? Observations ambulates with cane   ?  ? AROM  ? Overall AROM Comments shoulder WFL   ? Lumbar Flexion 0% limited   ? Lumbar Extension 10% limited   ? Lumbar - Right Side Bend 0% limited   ? Lumbar - Left Side Bend 0% limited   ?  ? Strength  ? Right Shoulder Flexion 5/5   ? Right Shoulder ABduction 5/5   ? Left Shoulder Flexion 5/5   ? Left Shoulder ABduction 5/5   ? Left Shoulder Internal Rotation 5/5   ? Left Shoulder External Rotation 5/5   ? ?  ?  ? ?  ? ? ? ?TODAY'S TREATMENT:  ?12/20/21 ? Abdominal isometrics 15X5" ? Heelslides alternating with abd iso 15X ? Bridge 3X10 ? SLR 2X10 each side with core stab ? Marching with Abd set 15X alternating ? ?12/13/21 ?Hooklying glute set 5x 5 second holds ?Bridge 2x 10  ?SLR with ab set 2x 10 bilateral  ? ? ?PATIENT EDUCATION: ?Education details: HEP, reassessment  findings, POC ?Person educated: Patient ?Education method: Explanation and Demonstration ?Education comprehension: verbalized understanding and returned demonstration ? ? ?HOME EXERCISE PROGRAM: ?Bird-dog progression 2/2 scap ret with [facility] ER, horizontal abd, PNF D2 2/16 ab brace, ab march, deadbug  ?Access Code: [email or web address] ?Date: 12/13/2021 ?- Supine Bridge  - 1 x daily - 7 x weekly - 2 sets - 10 reps ?- Supine Straight Leg Raises  - 1 x daily - 7 x weekly - 2 sets - 10 reps ? ? PT Short Term Goals -   ? ?  ? PT SHORT TERM GOAL #1  ? Title pt to be I with inital HEP   ? Time 3   ? Period Weeks   ? Status MET  ? Target Date 09/21/21   ?  ? PT SHORT TERM GOAL #2  ? Title pt to verbalize/ demo efficient posture and lifting mechanics to reduce and prevent low back pain   ? Time 3   ? Period Weeks   ? Status On-going   ? Target Date 09/21/21   ? ?  ?  ? ?  ? ? ? PT Long Term Goals  -   ? ?  ? PT LONG TERM GOAL #1  ? Title Demo trunk flexion and extension 3/5 to facilitate proximal stability   ? Baseline 2+/5 trunk flexion; 3-/5 trunk extension   ? Time 8   ? Period Weeks   ? Status On-going   ? Target Date 10/26/21   ?  ? PT LONG TERM GOAL #2  ? Title Report ability to stand/ambulate x 60 min to improve activity tolerance   ? Baseline self-report 20-35 minutes - pushing it with 40 minutes  ? Time 8   ? Period Weeks   ? Status On-going   ? Target Date 10/26/21   ?  ? PT LONG TERM GOAL #3  ? Title Patient will demonstrate L shoulder MMT 5/5 for improved ability to complete tasks at home.   ? Status MET  ? Target Date 10/26/21   ? ?  ?  ? ?  ? ? ? Plan -  ? ? Clinical Impression Statement Continued with core strengthening and stabilization.  Pt able to complete these without complaints or issues. PT given printout of TENS unit he could order from [facility] or comparable unit to help with pain control. Discussed increasing aerobic activity at gym or home.    Limited activity completed this session due to arriving late for session.  Patient will continue to benefit from physical therapy in order to improve function and reduce impairment. ?  ? Personal Factors and Comorbidities Behavior Pattern;Social Background;Time since onset of injury/illness/exacerbation   ? Examination-Activity Limitations Stand;Locomotion Level   ? Examination-Participation Restrictions Yard Work;Cleaning   ? Stability/Clinical Decision Making Stable/Uncomplicated   ? Rehab Potential Poor   ? PT Frequency 1x/week  ? PT Duration --   for 4 visits  ? PT Treatment/Interventions ADLs/Self Care Home Management;Electrical Stimulation;DME Instruction;Gait training;Functional mobility training;Therapeutic activities;Therapeutic exercise;Balance training;Neuromuscular re-education;Dry needling;Taping;Patient/family education   ? PT Next Visit Plan Continue with core stabilization and strengthening. F/U if ordered a TENS unit.   ? PT Home  Exercise Plan Bird-dog progression 2/2 scap ret with [facility] ER, horizontal abd, PNF D2 2/16 ab brace, ab march, deadbug 5/2 SLR, bridge  ? Consulted and Agree with Plan of Care Patient   ? ?  ?  ? ?  ? ? ?Tylin Force Sula Soda, PTA/CLT, WTA ?[phone] ? ? ?  Roseanne Reno B, PTA ?12/20/2021, 12:17 PM ? ?   ?

## 2021-12-26 ENCOUNTER — Ambulatory Visit (HOSPITAL_COMMUNITY): Payer: Medicare Other

## 2021-12-26 DIAGNOSIS — M545 Low back pain, unspecified: Secondary | ICD-10-CM | POA: Diagnosis not present

## 2021-12-26 DIAGNOSIS — R293 Abnormal posture: Secondary | ICD-10-CM

## 2021-12-26 DIAGNOSIS — M6281 Muscle weakness (generalized): Secondary | ICD-10-CM

## 2021-12-26 NOTE — Therapy (Signed)
?OUTPATIENT PHYSICAL THERAPY TREATMENT NOTE/ Recert ? ?Patient Name: Rodney Gallagher ?MRN: [id number] ?DOB:July 22, 1976, 46 y.o., male ?Today's Date: 12/26/2021 ? ?PCP: Mina Marble DO ?REFERRING PROVIDER:  Mina Marble DO ? ? PT End of Session - 12/26/21 1310   ? ? Visit Number 6   ? Number of Visits 8   ? Date for PT Re-Evaluation 01/10/22   ? Authorization Type UHC Medicare   ? PT Start Time [phone]   ? Activity Tolerance Patient tolerated treatment well   ? Behavior During Therapy [facility] for tasks assessed/performed   ? ?  ?  ? ?  ? ? ? ?Past Medical History:  ?Diagnosis Date  ? Arthritis   ? in back  ? Herniation of lumbar intervertebral disc   ? Hypertension   ? ?Past Surgical History:  ?Procedure Laterality Date  ? LUMBAR FUSION  09/18/2018  ? combined with laminectomy of L4-L5  ? ?Patient Active Problem List  ? Diagnosis Date Noted  ? S/P lumbar fusion 09/18/2018  ? ? ?REFERRING DIAG: Chronic LBP with functional impariment and G89.29 Other Chronic pain  ? ?THERAPY DIAG:  ?Chronic bilateral low back pain, unspecified whether sciatica present ? ?Muscle weakness (generalized) ? ?Abnormal posture ? ?PERTINENT HISTORY: Chronic pain, hx of lumbar fusion ? ?PRECAUTIONS: none ? ?SUBJECTIVE: Reports he's going to [facility] for injections 01/06/22. Patient has not purchased a TENS unit yet but is interested in having e-stim today ? ?PAIN:  ?Are you having pain? Yes: NPRS scale: 6/10 ?Pain location: low back ?Pain description: stabbing, aching, dull ?Aggravating factors: movement ?Relieving factors: laying ? ? ?Objective: ? [facility] PT Assessment   ? ?  ? Assessment  ? Medical Diagnosis chronic LBP/ L shoulder   ? Referring Provider (PT) DO Mina Marble   ?  ? Balance Screen  ? Has the patient fallen in the past 6 months Yes   ? How many times? 1   ? Has the patient had a decrease in activity level because of a fear of falling?  No   ? Is the patient reluctant to leave their home because of a fear of falling?  No    ?  ? Prior Function  ? Level of Independence Independent;Independent with community mobility with device   ? Vocation On disability   ?  ? Observation/Other Assessments  ? Observations ambulates with cane   ?  ? AROM  ? Overall AROM Comments shoulder WFL   ? Lumbar Flexion 0% limited   ? Lumbar Extension 10% limited   ? Lumbar - Right Side Bend 0% limited   ? Lumbar - Left Side Bend 0% limited   ?  ? Strength  ? Right Shoulder Flexion 5/5   ? Right Shoulder ABduction 5/5   ? Left Shoulder Flexion 5/5   ? Left Shoulder ABduction 5/5   ? Left Shoulder Internal Rotation 5/5   ? Left Shoulder External Rotation 5/5   ? ?  ?  ? ?  ? ? ? ?TODAY'S TREATMENT:  ? 12/26/21 ? Abdominal bracing 5" hold x 15 ? Bridge 2x 10  ? Hooklying glute set 5x 5 second holds ? Heelslides alternating with abd iso 15X ? Bridge with hip ADD x 10 ? Bridge with hip ABD with belt x 10 ?  ?  ?  ?  ?  ?  ?12/20/21 ? Abdominal isometrics 15X5" ? Heelslides alternating with abd iso 15X ? Bridge 3X10 ? SLR 2X10 each side with core  stab ? Marching with Abd set 15X alternating ? ?12/13/21 ?Hooklying glute set 5x 5 second holds ?Bridge 2x 10  ?SLR with ab set 2x 10 bilateral  ? ? ?PATIENT EDUCATION: ?Education details: HEP, reassessment findings, POC ?Person educated: Patient ?Education method: Explanation and Demonstration ?Education comprehension: verbalized understanding and returned demonstration ? ? ?HOME EXERCISE PROGRAM: ?Bird-dog progression 2/2 scap ret with [facility] ER, horizontal abd, PNF D2 2/16 ab brace, ab march, deadbug  ?Access Code: [email or web address] ?Date: 12/13/2021 ?- Supine Bridge  - 1 x daily - 7 x weekly - 2 sets - 10 reps ?- Supine Straight Leg Raises  - 1 x daily - 7 x weekly - 2 sets - 10 reps ? ? PT Short Term Goals -   ? ?  ? PT SHORT TERM GOAL #1  ? Title pt to be I with inital HEP   ? Time 3   ? Period Weeks   ? Status MET  ? Target Date 09/21/21   ?  ? PT SHORT TERM GOAL #2  ? Title pt to verbalize/ demo efficient posture and lifting mechanics  to reduce and prevent low back pain   ? Time 3   ? Period Weeks   ? Status On-going   ? Target Date 09/21/21   ? ?  ?  ? ?  ? ? ? PT Long Term Goals -   ? ?  ? PT LONG TERM GOAL #1  ? Title Demo trunk flexion and extension 3/5 to facilitate proximal stability   ? Baseline 2+/5 trunk flexion; 3-/5 trunk extension   ? Time 8   ? Period Weeks   ? Status On-going   ? Target Date 10/26/21   ?  ? PT LONG TERM GOAL #2  ? Title Report ability to stand/ambulate x 60 min to improve activity tolerance   ? Baseline self-report 20-35 minutes - pushing it with 40 minutes  ? Time 8   ? Period Weeks   ? Status On-going   ? Target Date 10/26/21   ?  ? PT LONG TERM GOAL #3  ? Title Patient will demonstrate L shoulder MMT 5/5 for improved ability to complete tasks at home.   ? Status MET  ? Target Date 10/26/21   ? ?  ?  ? ?  ? ? ? Plan -  ? ? Clinical Impression Statement Continued with core strengthening and stabilization.  Added bridge progression with hip Adduction and hip abduction.  Also discussed exercising in the pool at length as patient has a [employer]; our pool at the [place] location is too far for him to drive.  Encouraged him to try pool exercise at least once before next therapy session. No e-stim pads available today they are on order.  Patient will continue to benefit from physical therapy in order to improve function and reduce impairment. ?  ? Personal Factors and Comorbidities Behavior Pattern;Social Background;Time since onset of injury/illness/exacerbation   ? Examination-Activity Limitations Stand;Locomotion Level   ? Examination-Participation Restrictions Yard Work;Cleaning   ? Stability/Clinical Decision Making Stable/Uncomplicated   ? Rehab Potential Poor   ? PT Frequency 1x/week  ? PT Duration --   for 4 visits  ? PT Treatment/Interventions ADLs/Self Care Home Management;Electrical Stimulation;DME Instruction;Gait training;Functional mobility training;Therapeutic activities;Therapeutic  exercise;Balance training;Neuromuscular re-education;Dry needling;Taping;Patient/family education   ? PT Next Visit Plan Continue with core stabilization and strengthening. F/U if ordered a TENS unit.   ? PT Home Exercise Plan Bird-dog progression 2/2 scap  ret with [facility] ER, horizontal abd, PNF D2 2/16 ab brace, ab march, deadbug 5/2 SLR, bridge  ? Consulted and Agree with Plan of Care Patient   ? ?  ?  ? ?  ? ? ? ?1:49 PM, 12/26/21 ?Jamelle Noy Small Nalina Yeatman MPT ?[facility] ?[place] [phone] ?[phone] ? ?   ?

## 2022-01-02 ENCOUNTER — Ambulatory Visit (HOSPITAL_COMMUNITY): Payer: Medicare Other | Admitting: Physical Therapy

## 2022-01-02 DIAGNOSIS — M545 Low back pain, unspecified: Secondary | ICD-10-CM | POA: Diagnosis not present

## 2022-01-02 DIAGNOSIS — R293 Abnormal posture: Secondary | ICD-10-CM

## 2022-01-02 DIAGNOSIS — M6281 Muscle weakness (generalized): Secondary | ICD-10-CM

## 2022-01-02 NOTE — Therapy (Signed)
OUTPATIENT PHYSICAL THERAPY TREATMENT NOTE/ Recert  Patient Name: Rodney Gallagher MRN: [id number] DOB:1975/09/03, 46 y.o., male Today's Date: 01/02/2022  PCP: Mina Marble DO REFERRING PROVIDER:  Mina Marble DO   PT End of Session - 01/02/22 1208     Visit Number 7    Number of Visits 8    Date for PT Re-Evaluation 01/10/22    Authorization Type UHC Medicare    PT Start Time 1152    PT Stop Time 1215    PT Time Calculation (min) 23 min    Activity Tolerance Patient tolerated treatment well    Behavior During Therapy WFL for tasks assessed/performed              Past Medical History:  Diagnosis Date   Arthritis    in back   Herniation of lumbar intervertebral disc    Hypertension    Past Surgical History:  Procedure Laterality Date   LUMBAR FUSION  09/18/2018   combined with laminectomy of L4-L5   Patient Active Problem List   Diagnosis Date Noted   S/P lumbar fusion 09/18/2018    REFERRING DIAG: Chronic LBP with functional impariment and G89.29 Other Chronic pain   THERAPY DIAG:  Chronic bilateral low back pain, unspecified whether sciatica present  Muscle weakness (generalized)  Abnormal posture  PERTINENT HISTORY: Chronic pain, hx of lumbar fusion  PRECAUTIONS: none  SUBJECTIVE: Pt with questions regarding his visits here as he thought they were to only be TENS or needling appointments.  Reports he does not have time to go to the [facility] with his Kids schedules and has purchased/ received a TENS unit for home use.  Still with plans to go to [facility] for injections 01/06/22. Pain has not changed since last visit.  PAIN:  Are you having pain? Yes: NPRS scale: 6/10 Pain location: low back Pain description: stabbing, aching, dull Aggravating factors: movement Relieving factors: laying   Objective:   Measurements below completed at Evaluation  [facility] PT Assessment       Assessment   Medical Diagnosis chronic LBP/ L shoulder     Referring Provider (PT) DO Kiersten Mullis      Balance Screen   Has the patient fallen in the past 6 months Yes    How many times? 1    Has the patient had a decrease in activity level because of a fear of falling?  No    Is the patient reluctant to leave their home because of a fear of falling?  No      Prior Function   Level of Independence Independent;Independent with community mobility with device    Vocation On disability      Observation/Other Assessments   Observations ambulates with cane      AROM   Overall AROM Comments shoulder WFL    Lumbar Flexion 0% limited    Lumbar Extension 10% limited    Lumbar - Right Side Bend 0% limited    Lumbar - Left Side Bend 0% limited      Strength   Right Shoulder Flexion 5/5    Right Shoulder ABduction 5/5    Left Shoulder Flexion 5/5    Left Shoulder ABduction 5/5    Left Shoulder Internal Rotation 5/5    Left Shoulder External Rotation 5/5              TODAY'S TREATMENT:   01/02/22  Supine Bridge 3X10   SLR 3X10   Alternating heelslide 20X each  with core stab   Marching with abd set 20X   12/26/21  Abdominal bracing 5" hold x 15  Bridge 2x 10   Hooklying glute set 5x 5 second holds  Heelslides alternating with abd iso 15X  Bridge with hip ADD x 10  Bridge with hip ABD with belt x 10        12/20/21  Abdominal isometrics 15X5"  Heelslides alternating with abd iso 15X  Bridge 3X10  SLR 2X10 each side with core stab  Marching with Abd set 15X alternating  12/13/21 Hooklying glute set 5x 5 second holds Bridge 2x 10  SLR with ab set 2x 10 bilateral    PATIENT EDUCATION: Education details: 5/22:  TENS used for pain control, no physiological advantage of TENS Person educated: Patient Education method: Customer service manager Education comprehension: verbalized understanding and returned demonstration   HOME EXERCISE PROGRAM: Bird-dog progression 2/2 scap ret with [facility] ER, horizontal abd, PNF D2 2/16 ab  brace, ab march, deadbug  Access Code: [email or web address] Date: 12/13/2021 - Supine Bridge  - 1 x daily - 7 x weekly - 2 sets - 10 reps - Supine Straight Leg Raises  - 1 x daily - 7 x weekly - 2 sets - 10 reps   PT Short Term Goals -       PT SHORT TERM GOAL #1   Title pt to be I with inital HEP    Time 3    Period Weeks    Status MET   Target Date 09/21/21      PT SHORT TERM GOAL #2   Title pt to verbalize/ demo efficient posture and lifting mechanics to reduce and prevent low back pain    Time 3    Period Weeks    Status On-going    Target Date 09/21/21              PT Long Term Goals -       PT LONG TERM GOAL #1   Title Demo trunk flexion and extension 3/5 to facilitate proximal stability    Baseline 2+/5 trunk flexion; 3-/5 trunk extension    Time 8    Period Weeks    Status On-going    Target Date 10/26/21      PT LONG TERM GOAL #2   Title Report ability to stand/ambulate x 60 min to improve activity tolerance    Baseline self-report 20-35 minutes - pushing it with 40 minutes   Time 8    Period Weeks    Status On-going    Target Date 10/26/21      PT LONG TERM GOAL #3   Title Patient will demonstrate L shoulder MMT 5/5 for improved ability to complete tasks at home.    Status MET   Target Date 10/26/21              Plan -    Clinical Impression Statement Pt was late for appt and therapist unaware of 15 late arrival, thus did not receive full session today. Pt with questions regarding his remaining sessions thinking he was to only be coming for TENS and/or needling.  Explained we completed 1-2 sessions of TENS to introduce/see if helped while using and then referred him to buy a personal unit.  Explained to patient that the TENS does not do anything physiologically to improve his condition only gives him a different sensation other than pain to help him function/complete ADLS with less difficulty. Continued with core strengthening and  stabilization.  Pt has not  went back to [facility] to complete or try pool exercise.  Pt to be reassessed next session.    Personal Factors and Comorbidities Behavior Pattern;Social Background;Time since onset of injury/illness/exacerbation    Examination-Activity Limitations Stand;Locomotion Level    Examination-Participation Restrictions Yard Work;Cleaning    Stability/Clinical Decision Making Stable/Uncomplicated    Rehab Potential Poor    PT Frequency 1x/week   PT Duration --   for 4 visits   PT Treatment/Interventions ADLs/Self Care Home Management;Electrical Stimulation;DME Instruction;Gait training;Functional mobility training;Therapeutic activities;Therapeutic exercise;Balance training;Neuromuscular re-education;Dry needling;Taping;Patient/family education    PT Next Visit Plan Complete reassessment next session.         12:11 PM, 01/02/22 Teena Irani, PTA/CLT [place] Ph: [phone]

## 2022-01-02 NOTE — Therapy (Deleted)
[facility] [address] [place], [place], [address] Phone: [phone]   Fax:  [phone]  Physical Therapy Treatment  Patient Details  Name: Rodney Gallagher MRN: [id number] Date of Birth: 1975-11-30 Referring Provider (PT): DO Mina Marble   Encounter Date: 01/02/2022   PT End of Session - 01/02/22 1208     Visit Number 7    Number of Visits 8    Date for PT Re-Evaluation 01/10/22    Authorization Type UHC Medicare    PT Start Time 1152    PT Stop Time 1215    PT Time Calculation (min) 23 min    Activity Tolerance Patient tolerated treatment well    Behavior During Therapy [facility] for tasks assessed/performed             Past Medical History:  Diagnosis Date   Arthritis    in back   Herniation of lumbar intervertebral disc    Hypertension     Past Surgical History:  Procedure Laterality Date   LUMBAR FUSION  09/18/2018   combined with laminectomy of L4-L5    There were no vitals filed for this visit.                                 PT Short Term Goals - 12/13/21 1000       PT SHORT TERM GOAL #1   Title pt to be I with inital HEP    Time 3    Period Weeks    Status On-going    Target Date 09/21/21      PT SHORT TERM GOAL #2   Title pt to verbalize/ demo efficient posture and lifting mechanics to reduce and prevent low back pain    Time 3    Period Weeks    Status On-going    Target Date 09/21/21               PT Long Term Goals - 12/13/21 1001       PT LONG TERM GOAL #1   Title Demo trunk flexion and extension 3/5 to facilitate proximal stability    Baseline 2+/5 trunk flexion; 3-/5 trunk extension    Time 8    Period Weeks    Status On-going    Target Date 10/26/21      PT LONG TERM GOAL #2   Title Report ability to stand/ambulate x 60 min to improve activity tolerance    Baseline self-report 10-30 min standing/walking tolerance    Time 8    Period Weeks     Status On-going    Target Date 10/26/21      PT LONG TERM GOAL #3   Title Patient will demonstrate L shoulder MMT 5/5 for improved ability to complete tasks at home.    Status On-going    Target Date 10/26/21                    Patient will benefit from skilled therapeutic intervention in order to improve the following deficits and impairments:     Visit Diagnosis: Chronic bilateral low back pain, unspecified whether sciatica present  Muscle weakness (generalized)  Abnormal posture     Problem List Patient Active Problem List   Diagnosis Date Noted   S/P lumbar fusion 09/18/2018   Teena Irani, PTA/CLT [place] Ph: [phone]  Teena Irani, PTA  01/02/2022, 12:09 PM  [place], [place], [address] Phone: [phone]   Fax:  [phone]  Name: Rodney Gallagher MRN: [id number] Date of Birth: Aug 16, 1975

## 2022-01-11 ENCOUNTER — Ambulatory Visit (HOSPITAL_COMMUNITY): Payer: Medicare Other | Admitting: Physical Therapy

## 2022-01-11 ENCOUNTER — Encounter (HOSPITAL_COMMUNITY): Payer: Self-pay | Admitting: Physical Therapy

## 2022-01-11 DIAGNOSIS — M25512 Pain in left shoulder: Secondary | ICD-10-CM

## 2022-01-11 DIAGNOSIS — M545 Low back pain, unspecified: Secondary | ICD-10-CM

## 2022-01-11 DIAGNOSIS — M6281 Muscle weakness (generalized): Secondary | ICD-10-CM

## 2022-01-11 DIAGNOSIS — R293 Abnormal posture: Secondary | ICD-10-CM

## 2022-01-11 NOTE — Therapy (Signed)
OUTPATIENT PHYSICAL THERAPY TREATMENT NOTE/ Recert  Patient Name: Rodney Gallagher MRN: [id number] DOB:Feb 24, 1976, 46 y.o., male Today's Date: 01/11/2022  PCP: Mina Marble DO REFERRING PROVIDER:  Mina Marble DO  PHYSICAL THERAPY DISCHARGE SUMMARY  Visits from Start of Care: 8  Current functional level related to goals / functional outcomes: See below   Remaining deficits: See below   Education / Equipment: See below   Patient agrees to discharge. Patient goals were partially met. Patient is being discharged due to lack of progress.    PT End of Session - 01/11/22 1011     Visit Number 8    Number of Visits 8    Date for PT Re-Evaluation 01/10/22    Authorization Type [employer] Medicare    PT Start Time 1011   arrives late/delayed check in   PT Stop Time 1035    PT Time Calculation (min) 24 min    Activity Tolerance Patient tolerated treatment well    Behavior During Therapy WFL for tasks assessed/performed              Past Medical History:  Diagnosis Date   Arthritis    in back   Herniation of lumbar intervertebral disc    Hypertension    Past Surgical History:  Procedure Laterality Date   LUMBAR FUSION  09/18/2018   combined with laminectomy of L4-L5   Patient Active Problem List   Diagnosis Date Noted   S/P lumbar fusion 09/18/2018    REFERRING DIAG: Chronic LBP with functional impariment and G89.29 Other Chronic pain   THERAPY DIAG:  Chronic bilateral low back pain, unspecified whether sciatica present  Muscle weakness (generalized)  Abnormal posture  Left shoulder pain, unspecified chronicity  PERTINENT HISTORY: Chronic pain, hx of lumbar fusion  PRECAUTIONS: none  SUBJECTIVE: Patient states backs still the same. Has been more active and trying to keep head clear. He has been keeping up with HEP. Hip flexors sore from leg lifts. Patient has lost some weight. Patient states 10% improvement since beginning PT. Patient states he can  stand/walk 20-45 minutes at a time. Patient states greatest limitation is not being consistent. States he got a TENS unit and it caused small burns on his back it was the flex heat by flex tone unit.   PAIN:  Are you having pain? Yes: NPRS scale: 6/10 Pain location: low back Pain description: stabbing, aching, dull Aggravating factors: movement Relieving factors: laying   Objective:   Measurements below completed at Evaluation  [facility] PT Assessment       Assessment   Medical Diagnosis chronic LBP/ L shoulder    Referring Provider (PT) DO Kiersten Mullis      Balance Screen   Has the patient fallen in the past 6 months Yes    How many times? 1    Has the patient had a decrease in activity level because of a fear of falling?  No    Is the patient reluctant to leave their home because of a fear of falling?  No      Prior Function   Level of Independence Independent;Independent with community mobility with device    Vocation On disability      Observation/Other Assessments   Observations ambulates with cane      AROM   Overall AROM Comments shoulder WFL    Lumbar Flexion 0% limited    Lumbar Extension 10% limited    Lumbar - Right Side Bend 0% limited    Lumbar -  Left Side Bend 0% limited      Strength   Right Shoulder Flexion 5/5    Right Shoulder ABduction 5/5    Left Shoulder Flexion 5/5    Left Shoulder ABduction 5/5    Left Shoulder Internal Rotation 5/5    Left Shoulder External Rotation 5/5             [facility] PT Assessment - 01/11/22 0001       Assessment   Medical Diagnosis chronic LBP/ L shoulder    Referring Provider (PT) DO Mina Marble      Prior Function   Level of Independence Independent;Independent with community mobility with device    Vocation On disability      Cognition   Overall Cognitive Status Within Functional Limits for tasks assessed      Observation/Other Assessments   Observations ambulates with cane      AROM   Lumbar  Flexion 0% limited    Lumbar Extension 10% limited    Lumbar - Right Side Bend 0% limited    Lumbar - Left Side Bend 0% limited    Lumbar - Right Rotation 0% limited    Lumbar - Left Rotation 0% limited      Strength   Lumbar Flexion 2+/5   able to hold partial crunch position for 35 seconds   Lumbar Extension 4/5   able to hold superman 21 seconds              TODAY'S TREATMENT:  01/11/22 Reassessment   01/02/22  Supine Bridge 3X10   SLR 3X10   Alternating heelslide 20X each with core stab   Marching with abd set 20X   12/26/21  Abdominal bracing 5" hold x 15  Bridge 2x 10   Hooklying glute set 5x 5 second holds  Heelslides alternating with abd iso 15X  Bridge with hip ADD x 10  Bridge with hip ABD with belt x 10        12/20/21  Abdominal isometrics 15X5"  Heelslides alternating with abd iso 15X  Bridge 3X10  SLR 2X10 each side with core stab  Marching with Abd set 15X alternating  12/13/21 Hooklying glute set 5x 5 second holds Bridge 2x 10  SLR with ab set 2x 10 bilateral    PATIENT EDUCATION: Education details: 5/22:  TENS used for pain control, no physiological advantage of TENS 5/31 Reassessment findings, other possible tens unit, HEP Person educated: Patient Education method: Explanation and Demonstration Education comprehension: verbalized understanding and returned demonstration   HOME EXERCISE PROGRAM: Bird-dog progression 2/2 scap ret with [facility] ER, horizontal abd, PNF D2 2/16 ab brace, ab march, deadbug  Access Code: [email or web address] Date: 12/13/2021 - Supine Bridge  - 1 x daily - 7 x weekly - 2 sets - 10 reps - Supine Straight Leg Raises  - 1 x daily - 7 x weekly - 2 sets - 10 reps   PT Short Term Goals -       PT SHORT TERM GOAL #1   Title pt to be I with inital HEP    Time 3    Period Weeks    Status MET   Target Date 09/21/21      PT SHORT TERM GOAL #2   Title pt to verbalize/ demo efficient posture and lifting mechanics to reduce and  prevent low back pain    Time 3    Period Weeks    Status MET   Target Date 09/21/21  PT Long Term Goals -       PT LONG TERM GOAL #1   Title Demo trunk flexion and extension 3/5 to facilitate proximal stability    Baseline 2+/5 trunk flexion; 3-/5 trunk extension    Time 8    Period Weeks    Status Not Met   Target Date 10/26/21      PT LONG TERM GOAL #2   Title Report ability to stand/ambulate x 60 min to improve activity tolerance    Baseline self-report 20-45 minutes   Time 8    Period Weeks    Status Not met   Target Date 10/26/21      PT LONG TERM GOAL #3   Title Patient will demonstrate L shoulder MMT 5/5 for improved ability to complete tasks at home.    Status MET   Target Date 10/26/21              Plan -    Clinical Impression Statement Session limited due to late arrival. Patient has met 2/2 short term goals and 1/3 long term goals with stated ability to complete HEP and improvement in strength, posture and mechanics. Remaining goals not met due to continued symptoms and functional limitations. Patient with 2 small circular wounds on back in which he stated burns from estim unit he bought (flex heat by flex tone). Educated patient on possible different unit to try. Patient progress likely limited by compliance to HEP. Patient discharged from physical therapy at this time.     Personal Factors and Comorbidities Behavior Pattern;Social Background;Time since onset of injury/illness/exacerbation    Examination-Activity Limitations Stand;Locomotion Level    Examination-Participation Restrictions Yard Work;Cleaning    Stability/Clinical Decision Making Stable/Uncomplicated    Rehab Potential Poor    PT Frequency    PT Duration --     PT Treatment/Interventions ADLs/Self Care Home Management;Electrical Stimulation;DME Instruction;Gait training;Functional mobility training;Therapeutic activities;Therapeutic exercise;Balance training;Neuromuscular  re-education;Dry needling;Taping;Patient/family education    PT Next Visit Plan N/a         10:12 AM, 01/11/22 Mearl Latin PT, DPT Physical Therapist at [facility]

## 2022-01-11 NOTE — Addendum Note (Signed)
Addended by: Mearl Latin on: 01/11/2022 01:37 PM   Modules accepted: Orders

## 2022-10-04 ENCOUNTER — Telehealth: Payer: Self-pay | Admitting: Physical Medicine and Rehabilitation

## 2022-10-04 NOTE — Telephone Encounter (Signed)
Pt called requesting a call back. Pt states information concerning his recent injections. Please call pt at [phone].

## 2022-10-05 NOTE — Telephone Encounter (Signed)
LVM to return call to clinic concerning recent injection. Last injection was 04/2020

## 2022-10-06 ENCOUNTER — Telehealth: Payer: Self-pay | Admitting: Physical Medicine and Rehabilitation

## 2022-10-06 NOTE — Telephone Encounter (Signed)
Patient called. Returning a call to [employer].

## 2022-10-09 NOTE — Telephone Encounter (Signed)
LVM on home and cell phones to return call to clinic

## 2022-10-11 ENCOUNTER — Telehealth: Payer: Self-pay | Admitting: Physical Medicine and Rehabilitation

## 2022-10-11 NOTE — Telephone Encounter (Signed)
Patient called. Returning a call to [place] to schedule with Dr. Ernestina Patches.

## 2022-10-12 NOTE — Telephone Encounter (Signed)
Spoke with patient and he stated since he received his injection in September 2021, he has been urinating in the bed 2-3 times a month. He saw a urologist and he stated everything checked out fine. He is thinking the injection is causing him to urinate in the bed. Please advise

## 2023-01-17 ENCOUNTER — Other Ambulatory Visit: Payer: Self-pay | Admitting: Student in an Organized Health Care Education/Training Program

## 2023-01-17 DIAGNOSIS — M961 Postlaminectomy syndrome, not elsewhere classified: Secondary | ICD-10-CM

## 2023-01-17 DIAGNOSIS — M47816 Spondylosis without myelopathy or radiculopathy, lumbar region: Secondary | ICD-10-CM

## 2023-02-09 ENCOUNTER — Telehealth: Payer: Self-pay | Admitting: Physical Medicine and Rehabilitation

## 2023-02-09 NOTE — Telephone Encounter (Signed)
Patient called advised Dr. Alvester Morin was going to call him back concerning his issue. Patient would not give any more information. The number to contact patient is [phone] or [phone]

## 2023-02-27 ENCOUNTER — Encounter: Payer: Self-pay | Admitting: Gastroenterology

## 2023-02-27 NOTE — Progress Notes (Deleted)
Referring Provider: Joana Reamer, DO  Primary Care Physician:  Rodney Furry, MD Primary Gastroenterologist:  Dr. Bonnetta Gallagher chief complaint on file.   HPI:   Rodney Gallagher is a 47 y.o. male presenting today at the request of Rodney Reamer, DO for rectal bleeding.   Reviewed office visit with Rodney Hard, DO dated 02/16/2023.  Patient was presenting with complaint of bright red blood per rectum.  Reported increasing rectal bleeding since May 2024.  Had apparently been evaluated at [facility] ER, but no records were available for review.  Hemoccult was negative.  He was referred to GI for further evaluation.  Today:    Past Medical History:  Diagnosis Date   Arthritis    in back   Herniation of lumbar intervertebral disc    Hypertension     Past Surgical History:  Procedure Laterality Date   LUMBAR FUSION  09/18/2018   combined with laminectomy of L4-L5    Current Outpatient Medications  Medication Sig Dispense Refill   amLODipine (NORVASC) 10 MG tablet daily.     naproxen (NAPROSYN) 500 MG tablet Take 1 tablet (500 mg total) by mouth 2 (two) times daily. 20 tablet 0   oxyCODONE (OXY IR/ROXICODONE) 5 MG immediate release tablet Take 1 tablet (5 mg total) by mouth every 4 (four) hours as needed for moderate pain ((score 4 to 6)). 40 tablet 0   No current facility-administered medications for this visit.    Allergies as of 02/28/2023   (No Known Allergies)    No family history on file.  Social History   Socioeconomic History   Marital status: Single    Spouse name: Not on file   Number of children: Not on file   Years of education: Not on file   Highest education level: Not on file  Occupational History   Not on file  Tobacco Use   Smoking status: Former    Current packs/day: 0.00    Types: Cigarettes    Quit date: 03/13/2017    Years since quitting: 5.9   Smokeless tobacco: Never  Vaping Use   Vaping status: Every Day  Substance and  Sexual Activity   Alcohol use: Not Currently   Drug use: Yes    Types: Marijuana    Comment: uses every day, as well as CBD   Sexual activity: Not on file  Other Topics Concern   Not on file  Social History Narrative   ** Merged History Encounter **       Social Determinants of Health   Financial Resource Strain: Not on file  Food Insecurity: Not on file  Transportation Needs: Not on file  Physical Activity: Not on file  Stress: Not on file  Social Connections: Not on file  Intimate Partner Violence: Not on file    Review of Systems: Gen: Denies any fever, chills, fatigue, weight loss, lack of appetite.  CV: Denies chest pain, heart palpitations, peripheral edema, syncope.  Resp: Denies shortness of breath at rest or with exertion. Denies wheezing or cough.  GI: Denies dysphagia or odynophagia. Denies jaundice, hematemesis, fecal incontinence. GU : Denies urinary burning, urinary frequency, urinary hesitancy MS: Denies joint pain, muscle weakness, cramps, or limitation of movement.  Derm: Denies rash, itching, dry skin Psych: Denies depression, anxiety, memory loss, and confusion Heme: Denies bruising, bleeding, and enlarged lymph nodes.  Physical Exam: There were no vitals taken for this visit. General:   Alert and oriented. Pleasant and cooperative. Well-nourished  and well-developed.  Head:  Normocephalic and atraumatic. Eyes:  Without icterus, sclera clear and conjunctiva pink.  Ears:  Normal auditory acuity. Lungs:  Clear to auscultation bilaterally. No wheezes, rales, or rhonchi. No distress.  Heart:  S1, S2 present without murmurs appreciated.  Abdomen:  +BS, soft, non-tender and non-distended. No HSM noted. No guarding or rebound. No masses appreciated.  Rectal:  Deferred  Msk:  Symmetrical without gross deformities. Normal posture. Extremities:  Without edema. Neurologic:  Alert and  oriented x4;  grossly normal neurologically. Skin:  Intact without significant  lesions or rashes. Psych:  Alert and cooperative. Normal mood and affect.    Assessment:     Plan:  ***   Ermalinda Memos, PA-C [facility] 02/28/2023

## 2023-02-28 ENCOUNTER — Telehealth: Payer: Self-pay | Admitting: Gastroenterology

## 2023-02-28 ENCOUNTER — Encounter: Payer: Medicare Other | Admitting: Gastroenterology

## 2023-02-28 NOTE — Telephone Encounter (Signed)
Patient was scheduled for 1:30 and arrived at 1:45.  I messaged Kristen to ask if she could see him or reschedule.  She advised to reschedule him.  I told the patient I would have to reschedule and He said "this is important".  I offered him the Urgent spot on Thursday at 1:30 and he never responded.... he just walked out the door not acknowledging anything I said to him.

## 2023-03-01 ENCOUNTER — Ambulatory Visit (INDEPENDENT_AMBULATORY_CARE_PROVIDER_SITE_OTHER): Payer: Medicare Other | Admitting: Internal Medicine

## 2023-03-01 ENCOUNTER — Other Ambulatory Visit: Payer: Self-pay | Admitting: *Deleted

## 2023-03-01 ENCOUNTER — Encounter: Payer: Self-pay | Admitting: *Deleted

## 2023-03-01 ENCOUNTER — Encounter: Payer: Self-pay | Admitting: Internal Medicine

## 2023-03-01 VITALS — BP 146/83 | HR 71 | Temp 97.8°F | Ht 70.0 in | Wt 281.8 lb

## 2023-03-01 DIAGNOSIS — Z1211 Encounter for screening for malignant neoplasm of colon: Secondary | ICD-10-CM | POA: Diagnosis not present

## 2023-03-01 DIAGNOSIS — K625 Hemorrhage of anus and rectum: Secondary | ICD-10-CM | POA: Diagnosis not present

## 2023-03-01 MED ORDER — PEG 3350-KCL-NA BICARB-NACL 420 G PO SOLR: 4000.0000 mL | Freq: Once | ORAL | 0 refills | Status: AC

## 2023-03-01 NOTE — H&P (View-Only) (Signed)
Primary Care Physician:  Tanna Furry, MD Primary Gastroenterologist:  Dr. Marletta Lor  Chief Complaint  Patient presents with   New Patient (Initial Visit)    Pt referred for bleeding from rectum since July 3rd    HPI:   Rodney Gallagher is a 47 y.o. male who presents to clinic today by referral from his PCP Dr. Mauri Reading for evaluation.  Patient due for colonoscopy for colon cancer screening purposes.  No previous colonoscopy.  Denies any family history of colorectal malignancy.  No abdominal pain.  No unintentional weight loss.  Denies any upper GI symptoms including heartburn, reflux, dysphagia/odynophagia, epigastric or chest pain.  Does note some intermittent rectal bleeding with bowel movements, bright red blood, no clots.  No rectal pain or discomfort.  States his bowels move relatively well every 2 to 3 days.  Denies any straining.  States he had a CT scan done in [place] though I do not have access to this report.  Past Medical History:  Diagnosis Date   Arthritis    in back   Depression    Herniation of lumbar intervertebral disc    Hypertension     Past Surgical History:  Procedure Laterality Date   LUMBAR FUSION  09/18/2018   combined with laminectomy of L4-L5    Current Outpatient Medications  Medication Sig Dispense Refill   amLODipine (NORVASC) 10 MG tablet daily.     buprenorphine (SUBUTEX) 2 MG SUBL SL tablet Place under the tongue daily.     gabapentin (NEURONTIN) 100 MG capsule Take 500 mg by mouth 3 (three) times daily.     hydrochlorothiazide (HYDRODIURIL) 12.5 MG tablet Take 12.5 mg by mouth daily.     naproxen (NAPROSYN) 500 MG tablet Take 1 tablet (500 mg total) by mouth 2 (two) times daily. 20 tablet 0   oxyCODONE (OXY IR/ROXICODONE) 5 MG immediate release tablet Take 1 tablet (5 mg total) by mouth every 4 (four) hours as needed for moderate pain ((score 4 to 6)). 40 tablet 0   No current facility-administered medications for this visit.     Allergies as of 03/01/2023 - Review Complete 03/01/2023  Allergen Reaction Noted   Prozac [fluoxetine]  03/01/2023    No family history on file.  Social History   Socioeconomic History   Marital status: Single    Spouse name: Not on file   Number of children: Not on file   Years of education: Not on file   Highest education level: Not on file  Occupational History   Not on file  Tobacco Use   Smoking status: Former    Current packs/day: 0.00    Types: Cigarettes    Quit date: 03/13/2017    Years since quitting: 5.9   Smokeless tobacco: Never  Vaping Use   Vaping status: Every Day  Substance and Sexual Activity   Alcohol use: Not Currently   Drug use: Yes    Types: Marijuana    Comment: uses every day, as well as CBD   Sexual activity: Not on file  Other Topics Concern   Not on file  Social History Narrative   ** Merged History Encounter **       Social Determinants of Health   Financial Resource Strain: Not on file  Food Insecurity: Not on file  Transportation Needs: Not on file  Physical Activity: Not on file  Stress: Not on file  Social Connections: Not on file  Intimate Partner Violence: Not on file  Subjective: Review of Systems  Constitutional:  Negative for chills and fever.  HENT:  Negative for congestion and hearing loss.   Eyes:  Negative for blurred vision and double vision.  Respiratory:  Negative for cough and shortness of breath.   Cardiovascular:  Negative for chest pain and palpitations.  Gastrointestinal:  Positive for blood in stool. Negative for abdominal pain, constipation, diarrhea, heartburn, melena and vomiting.  Genitourinary:  Negative for dysuria and urgency.  Musculoskeletal:  Negative for joint pain and myalgias.  Skin:  Negative for itching and rash.  Neurological:  Negative for dizziness and headaches.  Psychiatric/Behavioral:  Negative for depression. The patient is not nervous/anxious.        Objective: BP (!)  146/83   Pulse 71   Temp 97.8 F (36.6 C)   Ht 5\' 10"  (1.778 m)   Wt 281 lb 12.8 oz (127.8 kg)   BMI 40.43 kg/m  Physical Exam Constitutional:      Appearance: Normal appearance.  HENT:     Head: Normocephalic and atraumatic.  Eyes:     Extraocular Movements: Extraocular movements intact.     Conjunctiva/sclera: Conjunctivae normal.  Cardiovascular:     Rate and Rhythm: Normal rate and regular rhythm.  Pulmonary:     Effort: Pulmonary effort is normal.     Breath sounds: Normal breath sounds.  Abdominal:     General: Bowel sounds are normal.     Palpations: Abdomen is soft.  Musculoskeletal:        General: Normal range of motion.     Cervical back: Normal range of motion and neck supple.  Skin:    General: Skin is warm.  Neurological:     General: No focal deficit present.     Mental Status: He is alert and oriented to person, place, and time.  Psychiatric:        Mood and Affect: Mood normal.        Behavior: Behavior normal.    Rectal exam: Deferred by patient until time of colonoscopy.  Rectal exam performed by PCP reviewed from 10 days ago was WNL.  Assessment: *Colon cancer screening *Rectal bleeding, mild intermittent  Plan: Will schedule for screening colonoscopy.The risks including infection, bleed, or perforation as well as benefits, limitations, alternatives and imponderables have been reviewed with the patient. Questions have been answered. All parties agreeable.  Intermittent mild rectal bleeding likely due to internal hemorrhoids.  Consider hemorrhoid banding pending colonoscopy results.  Will request CT scan from [facility] emergency department for review.  Thank you Dr. Mauri Reading for the kind referral.  03/01/2023 8:56 AM   Disclaimer: This note was dictated with voice recognition software. Similar sounding words can inadvertently be transcribed and may not be corrected upon review.

## 2023-03-01 NOTE — Patient Instructions (Signed)
We will schedule you for colonoscopy for colon cancer screening purposes as well as to evaluate your rectal bleeding.  I will request records from [facility] ER in regards to your CT scan.  I will call you after I have reviewed if any other workup needs to be done.  It was very nice meeting you today.  Dr. Marletta Lor

## 2023-03-01 NOTE — Progress Notes (Signed)
Primary Care Physician:  Tanna Furry, MD Primary Gastroenterologist:  Dr. Marletta Lor  Chief Complaint  Patient presents with   New Patient (Initial Visit)    Pt referred for bleeding from rectum since July 3rd    HPI:   Rodney Gallagher is a 47 y.o. male who presents to clinic today by referral from his PCP Dr. Mauri Reading for evaluation.  Patient due for colonoscopy for colon cancer screening purposes.  No previous colonoscopy.  Denies any family history of colorectal malignancy.  No abdominal pain.  No unintentional weight loss.  Denies any upper GI symptoms including heartburn, reflux, dysphagia/odynophagia, epigastric or chest pain.  Does note some intermittent rectal bleeding with bowel movements, bright red blood, no clots.  No rectal pain or discomfort.  States his bowels move relatively well every 2 to 3 days.  Denies any straining.  States he had a CT scan done in [place] though I do not have access to this report.  Past Medical History:  Diagnosis Date   Arthritis    in back   Depression    Herniation of lumbar intervertebral disc    Hypertension     Past Surgical History:  Procedure Laterality Date   LUMBAR FUSION  09/18/2018   combined with laminectomy of L4-L5    Current Outpatient Medications  Medication Sig Dispense Refill   amLODipine (NORVASC) 10 MG tablet daily.     buprenorphine (SUBUTEX) 2 MG SUBL SL tablet Place under the tongue daily.     gabapentin (NEURONTIN) 100 MG capsule Take 500 mg by mouth 3 (three) times daily.     hydrochlorothiazide (HYDRODIURIL) 12.5 MG tablet Take 12.5 mg by mouth daily.     naproxen (NAPROSYN) 500 MG tablet Take 1 tablet (500 mg total) by mouth 2 (two) times daily. 20 tablet 0   oxyCODONE (OXY IR/ROXICODONE) 5 MG immediate release tablet Take 1 tablet (5 mg total) by mouth every 4 (four) hours as needed for moderate pain ((score 4 to 6)). 40 tablet 0   No current facility-administered medications for this visit.     Allergies as of 03/01/2023 - Review Complete 03/01/2023  Allergen Reaction Noted   Prozac [fluoxetine]  03/01/2023    No family history on file.  Social History   Socioeconomic History   Marital status: Single    Spouse name: Not on file   Number of children: Not on file   Years of education: Not on file   Highest education level: Not on file  Occupational History   Not on file  Tobacco Use   Smoking status: Former    Current packs/day: 0.00    Types: Cigarettes    Quit date: 03/13/2017    Years since quitting: 5.9   Smokeless tobacco: Never  Vaping Use   Vaping status: Every Day  Substance and Sexual Activity   Alcohol use: Not Currently   Drug use: Yes    Types: Marijuana    Comment: uses every day, as well as CBD   Sexual activity: Not on file  Other Topics Concern   Not on file  Social History Narrative   ** Merged History Encounter **       Social Determinants of Health   Financial Resource Strain: Not on file  Food Insecurity: Not on file  Transportation Needs: Not on file  Physical Activity: Not on file  Stress: Not on file  Social Connections: Not on file  Intimate Partner Violence: Not on file  Subjective: Review of Systems  Constitutional:  Negative for chills and fever.  HENT:  Negative for congestion and hearing loss.   Eyes:  Negative for blurred vision and double vision.  Respiratory:  Negative for cough and shortness of breath.   Cardiovascular:  Negative for chest pain and palpitations.  Gastrointestinal:  Positive for blood in stool. Negative for abdominal pain, constipation, diarrhea, heartburn, melena and vomiting.  Genitourinary:  Negative for dysuria and urgency.  Musculoskeletal:  Negative for joint pain and myalgias.  Skin:  Negative for itching and rash.  Neurological:  Negative for dizziness and headaches.  Psychiatric/Behavioral:  Negative for depression. The patient is not nervous/anxious.        Objective: BP (!)  146/83   Pulse 71   Temp 97.8 F (36.6 C)   Ht 5\' 10"  (1.778 m)   Wt 281 lb 12.8 oz (127.8 kg)   BMI 40.43 kg/m  Physical Exam Constitutional:      Appearance: Normal appearance.  HENT:     Head: Normocephalic and atraumatic.  Eyes:     Extraocular Movements: Extraocular movements intact.     Conjunctiva/sclera: Conjunctivae normal.  Cardiovascular:     Rate and Rhythm: Normal rate and regular rhythm.  Pulmonary:     Effort: Pulmonary effort is normal.     Breath sounds: Normal breath sounds.  Abdominal:     General: Bowel sounds are normal.     Palpations: Abdomen is soft.  Musculoskeletal:        General: Normal range of motion.     Cervical back: Normal range of motion and neck supple.  Skin:    General: Skin is warm.  Neurological:     General: No focal deficit present.     Mental Status: He is alert and oriented to person, place, and time.  Psychiatric:        Mood and Affect: Mood normal.        Behavior: Behavior normal.    Rectal exam: Deferred by patient until time of colonoscopy.  Rectal exam performed by PCP reviewed from 10 days ago was WNL.  Assessment: *Colon cancer screening *Rectal bleeding, mild intermittent  Plan: Will schedule for screening colonoscopy.The risks including infection, bleed, or perforation as well as benefits, limitations, alternatives and imponderables have been reviewed with the patient. Questions have been answered. All parties agreeable.  Intermittent mild rectal bleeding likely due to internal hemorrhoids.  Consider hemorrhoid banding pending colonoscopy results.  Will request CT scan from [facility] emergency department for review.  Thank you Dr. Mauri Reading for the kind referral.  03/01/2023 8:56 AM   Disclaimer: This note was dictated with voice recognition software. Similar sounding words can inadvertently be transcribed and may not be corrected upon review.

## 2023-03-05 ENCOUNTER — Encounter: Payer: Self-pay | Admitting: Internal Medicine

## 2023-03-05 ENCOUNTER — Ambulatory Visit
Admission: RE | Admit: 2023-03-05 | Discharge: 2023-03-05 | Disposition: A | Discharge status: Home / Self Care | Payer: Medicare Other | Source: Ambulatory Visit | Attending: Student in an Organized Health Care Education/Training Program | Admitting: Student in an Organized Health Care Education/Training Program

## 2023-03-05 DIAGNOSIS — M961 Postlaminectomy syndrome, not elsewhere classified: Secondary | ICD-10-CM

## 2023-03-05 DIAGNOSIS — M47816 Spondylosis without myelopathy or radiculopathy, lumbar region: Secondary | ICD-10-CM

## 2023-03-07 ENCOUNTER — Telehealth: Payer: Self-pay | Admitting: Physical Medicine and Rehabilitation

## 2023-03-07 NOTE — Telephone Encounter (Signed)
Spoke with patient today via telephone. Patient was seen by Dr. Alvester Morin as office visit in 2021, he then underwent bilateral L5-S1 facet joint injections under fluoroscopic guidance on 04/22/2020. He did well with procedure. Several weeks after procedure patient called into office and reports he is now urinating on himself. He was then told to make follow up appointment with urologist. Since this initial message he has reached out multiple times, these telephone message can be reviewed in EPIC. He was addressed each time, he was seen by neurosurgeon at [facility] during this time as well where he underwent CT myelogram of lumbar spine. Dr. Alvester Morin specifically instructed him to discuss this with Dr. Yetta Barre or neurosurgery at [facility] [place]. We have also attempted to get him in office for OV, but he is difficult to communicate with. Patient called back today 03/07/2023, I promptly called him back today to discuss. He recently had lumbar MRI imaging with Dr. Allena Katz with [facility] and is now being treated with this practice. Patient requesting for Dr. Alvester Morin to "do something." He then proceeded to tell us he feels Dr. Alvester Morin is "ducking him." Dr. Alvester Morin spoke with patient, he then proceeded to yell and use curse words. I have contacted our clinic administrator Toniann Fail May to have him dismissed from our practice.

## 2023-03-07 NOTE — Telephone Encounter (Signed)
Patient called. Would like to speak with Dr. Alvester Morin. His call back number is [phone]

## 2023-03-08 NOTE — Telephone Encounter (Signed)
Patient called in and left voicemail asking for an office visit. This was after the encounter was sent from [facility].

## 2023-03-09 ENCOUNTER — Telehealth: Payer: Self-pay | Admitting: Physical Medicine and Rehabilitation

## 2023-03-09 NOTE — Telephone Encounter (Signed)
Patient called would like an appointment with Dr. Alvester Morin. Cb# [phone]

## 2023-03-12 ENCOUNTER — Telehealth: Payer: Self-pay | Admitting: *Deleted

## 2023-03-12 ENCOUNTER — Ambulatory Visit: Payer: Medicare Other | Admitting: Gastroenterology

## 2023-03-12 ENCOUNTER — Encounter: Payer: Self-pay | Admitting: Physical Medicine and Rehabilitation

## 2023-03-12 NOTE — Telephone Encounter (Signed)
Dismissed from [facility] per Dr Alvester Morin.  Letter to be mailed out 03/13/23.

## 2023-03-12 NOTE — Telephone Encounter (Signed)
Jarrett Soho called from [facility] and left vm asking for labs to be faxed over on pt.  Labs faxed to [phone].

## 2023-03-12 NOTE — Telephone Encounter (Signed)
Patient has already s/w Dr Alvester Morin.

## 2023-03-13 ENCOUNTER — Other Ambulatory Visit: Payer: Self-pay

## 2023-03-13 ENCOUNTER — Encounter (HOSPITAL_COMMUNITY)
Admission: RE | Disposition: A | Discharge status: Home / Self Care | Payer: Self-pay | Source: Home / Self Care | Attending: Internal Medicine

## 2023-03-13 ENCOUNTER — Ambulatory Visit (HOSPITAL_BASED_OUTPATIENT_CLINIC_OR_DEPARTMENT_OTHER): Payer: Medicare Other | Admitting: Certified Registered Nurse Anesthetist

## 2023-03-13 ENCOUNTER — Ambulatory Visit (HOSPITAL_COMMUNITY): Payer: Medicare Other | Admitting: Certified Registered Nurse Anesthetist

## 2023-03-13 ENCOUNTER — Encounter (HOSPITAL_COMMUNITY): Payer: Self-pay

## 2023-03-13 ENCOUNTER — Ambulatory Visit (HOSPITAL_COMMUNITY)
Admission: RE | Admit: 2023-03-13 | Discharge: 2023-03-13 | Disposition: A | Discharge status: Home / Self Care | Payer: Medicare Other | Attending: Internal Medicine | Admitting: Internal Medicine

## 2023-03-13 DIAGNOSIS — K6289 Other specified diseases of anus and rectum: Secondary | ICD-10-CM | POA: Diagnosis not present

## 2023-03-13 DIAGNOSIS — K649 Unspecified hemorrhoids: Secondary | ICD-10-CM | POA: Diagnosis not present

## 2023-03-13 DIAGNOSIS — F1290 Cannabis use, unspecified, uncomplicated: Secondary | ICD-10-CM | POA: Insufficient documentation

## 2023-03-13 DIAGNOSIS — K648 Other hemorrhoids: Secondary | ICD-10-CM | POA: Insufficient documentation

## 2023-03-13 DIAGNOSIS — K625 Hemorrhage of anus and rectum: Secondary | ICD-10-CM | POA: Diagnosis present

## 2023-03-13 DIAGNOSIS — D124 Benign neoplasm of descending colon: Secondary | ICD-10-CM | POA: Diagnosis not present

## 2023-03-13 DIAGNOSIS — F17290 Nicotine dependence, other tobacco product, uncomplicated: Secondary | ICD-10-CM | POA: Diagnosis not present

## 2023-03-13 DIAGNOSIS — K635 Polyp of colon: Secondary | ICD-10-CM

## 2023-03-13 DIAGNOSIS — I10 Essential (primary) hypertension: Secondary | ICD-10-CM | POA: Diagnosis not present

## 2023-03-13 HISTORY — PX: COLONOSCOPY WITH PROPOFOL: SHX5780

## 2023-03-13 HISTORY — PX: BIOPSY: SHX5522

## 2023-03-13 HISTORY — PX: POLYPECTOMY: SHX5525

## 2023-03-13 SURGERY — COLONOSCOPY WITH PROPOFOL
Anesthesia: General

## 2023-03-13 MED ORDER — LACTATED RINGERS IV SOLN: INTRAVENOUS | Status: DC

## 2023-03-13 MED ORDER — PROPOFOL 10 MG/ML IV BOLUS
INTRAVENOUS | Status: DC | PRN
Start: 2023-03-13 — End: 2023-03-13
  Administered 2023-03-13: 100 mg via INTRAVENOUS

## 2023-03-13 MED ORDER — PROPOFOL 500 MG/50ML IV EMUL
INTRAVENOUS | Status: DC | PRN
  Administered 2023-03-13: 150 ug/kg/min via INTRAVENOUS

## 2023-03-13 NOTE — Anesthesia Preprocedure Evaluation (Signed)
Anesthesia Evaluation  Patient identified by MRN, date of birth, ID band Patient awake    Reviewed: Allergy & Precautions, H&P , NPO status , Patient's Chart, lab work & pertinent test results, reviewed documented beta blocker date and time   Airway Mallampati: II  TM Distance: >3 FB Neck ROM: full    Dental no notable dental hx.    Pulmonary neg pulmonary ROS, former smoker   Pulmonary exam normal breath sounds clear to auscultation       Cardiovascular Exercise Tolerance: Good hypertension, negative cardio ROS  Rhythm:regular Rate:Normal     Neuro/Psych  PSYCHIATRIC DISORDERS  Depression    negative neurological ROS  negative psych ROS   GI/Hepatic negative GI ROS, Neg liver ROS,,,  Endo/Other  negative endocrine ROS    Renal/GU negative Renal ROS  negative genitourinary   Musculoskeletal   Abdominal   Peds  Hematology negative hematology ROS (+)   Anesthesia Other Findings   Reproductive/Obstetrics negative OB ROS                             Anesthesia Physical Anesthesia Plan  ASA: 2  Anesthesia Plan: General   Post-op Pain Management:    Induction:   PONV Risk Score and Plan: Propofol infusion  Airway Management Planned:   Additional Equipment:   Intra-op Plan:   Post-operative Plan:   Informed Consent: I have reviewed the patients History and Physical, chart, labs and discussed the procedure including the risks, benefits and alternatives for the proposed anesthesia with the patient or authorized representative who has indicated his/her understanding and acceptance.     Dental Advisory Given  Plan Discussed with: CRNA  Anesthesia Plan Comments:        Anesthesia Quick Evaluation

## 2023-03-13 NOTE — Telephone Encounter (Signed)
noted 

## 2023-03-13 NOTE — Interval H&P Note (Signed)
History and Physical Interval Note:  03/13/2023 12:52 PM  Rodney Gallagher  has presented today for surgery, with the diagnosis of rectal bleeding.  The various methods of treatment have been discussed with the patient and family. After consideration of risks, benefits and other options for treatment, the patient has consented to  Procedure(s) with comments: COLONOSCOPY WITH PROPOFOL (N/A) - 230pm, asa 2 as a surgical intervention.  The patient's history has been reviewed, patient examined, no change in status, stable for surgery.  I have reviewed the patient's chart and labs.  Questions were answered to the patient's satisfaction.     Lanelle Bal

## 2023-03-13 NOTE — Discharge Instructions (Addendum)
  Colonoscopy Discharge Instructions  Read the instructions outlined below and refer to this sheet in the next few weeks. These discharge instructions provide you with general information on caring for yourself after you leave the hospital. Your doctor may also give you specific instructions. While your treatment has been planned according to the most current medical practices available, unavoidable complications occasionally occur.   ACTIVITY You may resume your regular activity, but move at a slower pace for the next 24 hours.  Take frequent rest periods for the next 24 hours.  Walking will help get rid of the air and reduce the bloated feeling in your belly (abdomen).  No driving for 24 hours (because of the medicine (anesthesia) used during the test).   Do not sign any important legal documents or operate any machinery for 24 hours (because of the anesthesia used during the test).  NUTRITION Drink plenty of fluids.  You may resume your normal diet as instructed by your doctor.  Begin with a light meal and progress to your normal diet. Heavy or fried foods are harder to digest and may make you feel sick to your stomach (nauseated).  Avoid alcoholic beverages for 24 hours or as instructed.  MEDICATIONS You may resume your normal medications unless your doctor tells you otherwise.  WHAT YOU CAN EXPECT TODAY Some feelings of bloating in the abdomen.  Passage of more gas than usual.  Spotting of blood in your stool or on the toilet paper.  IF YOU HAD POLYPS REMOVED DURING THE COLONOSCOPY: No aspirin products for 7 days or as instructed.  No alcohol for 7 days or as instructed.  Eat a soft diet for the next 24 hours.  FINDING OUT THE RESULTS OF YOUR TEST Not all test results are available during your visit. If your test results are not back during the visit, make an appointment with your caregiver to find out the results. Do not assume everything is normal if you have not heard from your  caregiver or the medical facility. It is important for you to follow up on all of your test results.  SEEK IMMEDIATE MEDICAL ATTENTION IF: You have more than a spotting of blood in your stool.  Your belly is swollen (abdominal distention).  You are nauseated or vomiting.  You have a temperature over 101.  You have abdominal pain or discomfort that is severe or gets worse throughout the day.   Your colonoscopy revealed 2 small polyp(s) which I removed successfully.   You also had mild amount of inflammation in your rectum consistent with proctitis.  This could have been due to the bowel preparation itself.  I took samples in this region as well as the rest of your colon to check for underlying inflammatory bowel disease such as ulcerative colitis  Await pathology results, my office will contact you.   Follow-up in GI office in 2 to 3 months. Message sent to the office and will contact you about an appointment.   I hope you have a great rest of your week!  Hennie Duos. Marletta Lor, D.O. Gastroenterology and Hepatology [facility]

## 2023-03-13 NOTE — Op Note (Addendum)
[facility] Patient Name: Rodney Gallagher Procedure Date: 03/13/2023 12:52 PM MRN: [id number] Date of Birth: January 18, 1976 Attending MD: Hennie Duos. Marletta Lor , [place], [id number] CSN: [id number] Age: 47 Admit Type: Outpatient Procedure:                Colonoscopy Indications:              Rectal bleeding Providers:                Hennie Duos. Marletta Lor, DO, Buel Ream. Thomasena Edis RN, RN,                            Dyann Ruddle, Elinor Parkinson Referring MD:              Medicines:                See the Anesthesia note for documentation of the                            administered medications Complications:            No immediate complications. Estimated Blood Loss:     Estimated blood loss was minimal. Procedure:                Pre-Anesthesia Assessment:                           - The anesthesia plan was to use monitored                            anesthesia care (MAC).                           After obtaining informed consent, the colonoscope                            was passed under direct vision. Throughout the                            procedure, the patient's blood pressure, pulse, and                            oxygen saturations were monitored continuously. The                            [id number]-HQ190L ([id number]) scope was introduced through                            the anus and advanced to the the terminal ileum,                            with identification of the appendiceal orifice and                            IC valve. The colonoscopy was performed without                            difficulty. The patient tolerated  the procedure                            well. The quality of the bowel preparation was                            evaluated using the BBPS [facility] Bowel Preparation                            Scale) with scores of: Right Colon = 3, Transverse                            Colon = 3 and Left Colon = 3 (entire mucosa seen                            well with no  residual staining, small fragments of                            stool or opaque liquid). The total BBPS score                            equals 9. Scope In: 1:04:14 PM Scope Out: 1:24:35 PM Scope Withdrawal Time: 0 hours 15 minutes 50 seconds  Total Procedure Duration: 0 hours 20 minutes 21 seconds  Findings:      Non-bleeding internal hemorrhoids were found during endoscopy.      Localized mild inflammation characterized by erythema was found in the       rectum. Biopsies were taken with a cold forceps for histology.      Biopsies were taken with a cold forceps in the entire colon for       histology.      The terminal ileum appeared normal.      A 2 mm polyp was found in the descending colon. The polyp was sessile.       The polyp was removed with a cold biopsy forceps. Resection and       retrieval were complete.      A 4 mm polyp was found in the descending colon. The polyp was sessile.       The polyp was removed with a cold snare. Resection and retrieval were       complete.      The exam was otherwise without abnormality. Impression:               - Non-bleeding internal hemorrhoids.                           - Localized mild inflammation was found in the                            rectum secondary to proctitis. Biopsied.                           - The examined portion of the ileum was normal.                           - One 2  mm polyp in the descending colon, removed                            with a cold biopsy forceps. Resected and retrieved.                           - One 4 mm polyp in the descending colon, removed                            with a cold snare. Resected and retrieved.                           - The examination was otherwise normal.                           - Biopsies were taken with a cold forceps for                            histology in the entire colon. Moderate Sedation:      Per Anesthesia Care Recommendation:           - Patient has a contact  number available for                            emergencies. The signs and symptoms of potential                            delayed complications were discussed with the                            patient. Return to normal activities tomorrow.                            Written discharge instructions were provided to the                            patient.                           - Resume previous diet.                           - Continue present medications.                           - Await pathology results.                           - Repeat colonoscopy date to be determined after                            pending pathology results are reviewed for                            surveillance based on pathology results.                           -  Return to GI clinic in 3 months. Procedure Code(s):        --- Professional ---                           [phone], Colonoscopy, flexible; with removal of                            tumor(s), polyp(s), or other lesion(s) by snare                            technique                           45380, 59, Colonoscopy, flexible; with biopsy,                            single or multiple Diagnosis Code(s):        --- Professional ---                           K62.89, Other specified diseases of anus and rectum                           K64.8, Other hemorrhoids                           D12.4, Benign neoplasm of descending colon                           K62.5, Hemorrhage of anus and rectum CPT copyright 2022 American Medical Association. All rights reserved. The codes documented in this report are preliminary and upon coder review may  be revised to meet current compliance requirements. Hennie Duos. Marletta Lor, DO Hennie Duos. Marletta Lor, DO 03/13/2023 1:27:39 PM This report has been signed electronically. Number of Addenda: 0

## 2023-03-13 NOTE — Transfer of Care (Signed)
Immediate Anesthesia Transfer of Care Note  Patient: Rodney Gallagher  Procedure(s) Performed: COLONOSCOPY WITH PROPOFOL BIOPSY  Patient Location: Endoscopy Unit  Anesthesia Type:General  Level of Consciousness: awake  Airway & Oxygen Therapy: Patient Spontanous Breathing  Post-op Assessment: Report given to RN and Post -op Vital signs reviewed and stable  Post vital signs: Reviewed and stable  Last Vitals:  Vitals Value Taken Time  BP 90/52 03/13/23 1327  Temp 36.5 C 03/13/23 1327  Pulse 71 03/13/23 1327  Resp 16 03/13/23 1327  SpO2 96 % 03/13/23 1327    Last Pain:  Vitals:   03/13/23 1327  TempSrc: Oral  PainSc: 0-No pain      Patients Stated Pain Goal: 7 (03/13/23 1225)  Complications: No notable events documented.

## 2023-03-15 ENCOUNTER — Telehealth: Payer: Self-pay | Admitting: Physical Medicine and Rehabilitation

## 2023-03-15 NOTE — Telephone Encounter (Signed)
Patient came in wanting to make an appt about some surgery he had in 2021.CB#[phone]

## 2023-03-15 NOTE — Telephone Encounter (Signed)
I called patient, sounded like line was picked up then disconnected.  Will try to call patient again later.

## 2023-03-16 ENCOUNTER — Encounter (HOSPITAL_COMMUNITY): Payer: Self-pay | Admitting: Internal Medicine

## 2023-03-16 NOTE — Anesthesia Postprocedure Evaluation (Signed)
Anesthesia Post Note  Patient: Rodney Gallagher  Procedure(s) Performed: COLONOSCOPY WITH PROPOFOL BIOPSY POLYPECTOMY  Patient location during evaluation: Phase II Anesthesia Type: General Level of consciousness: awake Pain management: pain level controlled Vital Signs Assessment: post-procedure vital signs reviewed and stable Respiratory status: spontaneous breathing and respiratory function stable Cardiovascular status: blood pressure returned to baseline and stable Postop Assessment: no headache and no apparent nausea or vomiting Anesthetic complications: no Comments: Late entry   No notable events documented.   Last Vitals:  Vitals:   03/13/23 1245 03/13/23 1327  BP: 131/85 (!) 90/52  Pulse: 73 71  Resp: 11 16  Temp: 37.2 C 36.5 C  SpO2: 97% 96%    Last Pain:  Vitals:   03/13/23 1327  TempSrc: Oral  PainSc: 0-No pain                 Windell Norfolk

## 2023-03-19 NOTE — Telephone Encounter (Signed)
FYI to you.  I talked to patient and he was pleasant on the phone.  I told him that Dr Alvester Morin would no longer be able to see him in clinic.  He said "so he is the one who did this to me, then is just not going to treat me now"? Patient says it was the injection that Dr Alvester Morin did that caused the incontinence.  I advised him I did not know anything about that, and went over what the dismissal letter said.  He understands.  I did ask him if he told Dr Eliane Decree office about the issue with incontinence, and he says he did.  He recently had the MRI and I advised him best course for him right now  be to see what they recommend, and not have any treatment or injections by other providers until they advise further.  He understands, and says to tell Dr Alvester Morin that he will be talking to him soon.

## 2023-04-10 NOTE — Telephone Encounter (Signed)
Pt is requesting his lab results. Please advise.

## 2023-04-20 ENCOUNTER — Telehealth: Payer: Self-pay

## 2023-04-20 NOTE — Telephone Encounter (Signed)
Mancel Bale,  Dr Marletta Lor wants this pt brought in to see him. He states you can use an urgent spot if needed.

## 2023-04-20 NOTE — Telephone Encounter (Signed)
Please send me this pt's pathology results. He is very sarcastic with his messages. It will be even better if you can call this pt due to that exact reason. Please advise

## 2023-04-25 ENCOUNTER — Encounter: Payer: Medicare Other | Admitting: Internal Medicine

## 2023-04-25 ENCOUNTER — Ambulatory Visit (INDEPENDENT_AMBULATORY_CARE_PROVIDER_SITE_OTHER): Payer: Medicare Other | Admitting: Internal Medicine

## 2023-04-25 ENCOUNTER — Encounter: Payer: Self-pay | Admitting: Internal Medicine

## 2023-04-25 VITALS — BP 121/72 | HR 81 | Temp 98.7°F | Ht 71.0 in | Wt 275.9 lb

## 2023-04-25 DIAGNOSIS — Z791 Long term (current) use of non-steroidal anti-inflammatories (NSAID): Secondary | ICD-10-CM | POA: Diagnosis not present

## 2023-04-25 DIAGNOSIS — K529 Noninfective gastroenteritis and colitis, unspecified: Secondary | ICD-10-CM | POA: Diagnosis not present

## 2023-04-25 DIAGNOSIS — Z6838 Body mass index (BMI) 38.0-38.9, adult: Secondary | ICD-10-CM | POA: Diagnosis not present

## 2023-04-25 DIAGNOSIS — E669 Obesity, unspecified: Secondary | ICD-10-CM

## 2023-04-25 NOTE — Patient Instructions (Signed)
We will tentatively plan on flexible sigmoidoscopy in 3 months to reevaluate your colon given inflammation seen in your rectum prior.  Would recommend you try to limit your naproxen use as best that you can.  Congratulations on your weight loss, that is fantastic.  Follow-up with me in 3 months or sooner if needed.  Dr. Marletta Lor

## 2023-04-25 NOTE — Progress Notes (Signed)
Primary Care Physician:  Tanna Furry, MD Primary Gastroenterologist:  Dr. Marletta Lor  Chief Complaint  Patient presents with   Follow-up    Patient here today for a follow up visit from recent [facility] on 03/13/2023.    HPI:   Rodney Gallagher is a 47 y.o. male who presents to clinic today for follow-up visit.  Previously seen to schedule screening colonoscopy.  At the time he was having intermittent rectal bleeding as well.  Underwent colonoscopy 03/13/2023 which showed mild inflammation of his rectum.  Path report:   A. COLON, RIGHT SIDE, BIOPSY:  Colonic mucosa with superficial erosion and lamina propria histocytic  clusters suggestive of granulomas; see comment  No evidence of microscopic colitis   B. COLON, LEFT SIDED, BIOPSY:  Colonic mucosa with no significant pathologic changes.  No microscopic colitis, active inflammation or granulomas.   C. RECTUM, BIOPSY:  Colonic mucosa with no significant pathologic changes.  No microscopic colitis, active inflammation or granulomas.   D. COLON, DESCENDING, POLYPECTOMY:  Colonic mucosa with benign lymphoid aggregate.  Multiple additional levels examined.   Today states he is doing great.  Bowels moving well.  No issues with diarrhea or constipation.  No abdominal rectal pain.  No melena.  Rectal bleeding has resolved.  Has lost nearly 20 pounds intentionally.  States he feels great.  Does chronically take naproxen 1-2 times daily.  Past Medical History:  Diagnosis Date   Arthritis    in back   Depression    Herniation of lumbar intervertebral disc    Hypertension     Past Surgical History:  Procedure Laterality Date   BIOPSY  03/13/2023   Procedure: BIOPSY;  Surgeon: Lanelle Bal, DO;  Location: [facility];  Service: Endoscopy;;   COLONOSCOPY WITH PROPOFOL N/A 03/13/2023   Procedure: COLONOSCOPY WITH PROPOFOL;  Surgeon: Lanelle Bal, DO;  Location: [facility];  Service: Endoscopy;  Laterality:  N/A;  230pm, asa 2   LUMBAR FUSION  09/18/2018   combined with laminectomy of L4-L5   POLYPECTOMY  03/13/2023   Procedure: POLYPECTOMY;  Surgeon: Lanelle Bal, DO;  Location: [facility];  Service: Endoscopy;;    Current Outpatient Medications  Medication Sig Dispense Refill   amLODipine (NORVASC) 5 MG tablet Take 5 mg by mouth daily.     Ascorbic Acid (VITAMIN C PO) Take 1 tablet by mouth every other day.     buprenorphine (BUTRANS) 20 MCG/HR PTWK Place 1 patch onto the skin every Sunday.     buPROPion (WELLBUTRIN XL) 300 MG 24 hr tablet Take 300 mg by mouth every morning.     gabapentin (NEURONTIN) 300 MG capsule Take 900 mg by mouth 3 (three) times daily.     hydrochlorothiazide (HYDRODIURIL) 25 MG tablet Take 25 mg by mouth every morning.     Multiple Vitamin (MULTIVITAMIN WITH MINERALS) TABS tablet Take 1 tablet by mouth every other day.     naproxen (NAPROSYN) 500 MG tablet Take 1 tablet (500 mg total) by mouth 2 (two) times daily. 20 tablet 0   Omega-3 Fatty Acids (FISH OIL PO) Take 1 capsule by mouth every other day.     oxyCODONE-acetaminophen (PERCOCET) 10-325 MG tablet Take 1 tablet by mouth every 4 (four) hours as needed for pain.     No current facility-administered medications for this visit.    Allergies as of 04/25/2023 - Review Complete 04/25/2023  Allergen Reaction Noted   Prozac [fluoxetine]  03/01/2023  History reviewed. No pertinent family history.  Social History   Socioeconomic History   Marital status: Single    Spouse name: Not on file   Number of children: Not on file   Years of education: Not on file   Highest education level: Not on file  Occupational History   Not on file  Tobacco Use   Smoking status: Former    Current packs/day: 0.00    Types: Cigarettes    Quit date: 03/13/2017    Years since quitting: 6.1   Smokeless tobacco: Never  Vaping Use   Vaping status: Every Day  Substance and Sexual Activity   Alcohol use: Not  Currently   Drug use: Yes    Types: Marijuana    Comment: uses every day, as well as CBD; last used 03/13/23   Sexual activity: Not on file  Other Topics Concern   Not on file  Social History Narrative   ** Merged History Encounter **       Social Determinants of Health   Financial Resource Strain: Not on file  Food Insecurity: Not on file  Transportation Needs: Not on file  Physical Activity: Not on file  Stress: Not on file  Social Connections: Not on file  Intimate Partner Violence: Not on file    Subjective: Review of Systems  Constitutional:  Negative for chills and fever.  HENT:  Negative for congestion and hearing loss.   Eyes:  Negative for blurred vision and double vision.  Respiratory:  Negative for cough and shortness of breath.   Cardiovascular:  Negative for chest pain and palpitations.  Gastrointestinal:  Negative for abdominal pain, blood in stool, constipation, diarrhea, heartburn, melena and vomiting.  Genitourinary:  Negative for dysuria and urgency.  Musculoskeletal:  Negative for joint pain and myalgias.  Skin:  Negative for itching and rash.  Neurological:  Negative for dizziness and headaches.  Psychiatric/Behavioral:  Negative for depression. The patient is not nervous/anxious.        Objective: BP 121/72 (BP Location: Left Arm, Patient Position: Sitting, Cuff Size: Large)   Pulse 81   Temp 98.7 F (37.1 C) (Temporal)   Ht 5\' 11"  (1.803 m)   Wt 275 lb 14.4 oz (125.1 kg)   BMI 38.48 kg/m  Physical Exam Constitutional:      Appearance: Normal appearance.  HENT:     Head: Normocephalic and atraumatic.  Eyes:     Extraocular Movements: Extraocular movements intact.     Conjunctiva/sclera: Conjunctivae normal.  Cardiovascular:     Rate and Rhythm: Normal rate and regular rhythm.  Pulmonary:     Effort: Pulmonary effort is normal.     Breath sounds: Normal breath sounds.  Abdominal:     General: Bowel sounds are normal.     Palpations:  Abdomen is soft.  Musculoskeletal:        General: Normal range of motion.     Cervical back: Normal range of motion and neck supple.  Skin:    General: Skin is warm.  Neurological:     General: No focal deficit present.     Mental Status: He is alert and oriented to person, place, and time.  Psychiatric:        Mood and Affect: Mood normal.        Behavior: Behavior normal.      Assessment: *Colitis *Obesity *Chronic NSAID use  Plan: Discussed colonoscopy and pathology results in depth with patient today.  His pathology report does not correlate  with his findings on colonoscopy.  Likely had mislabeling of his biopsy bottles at time of colonoscopy.  I have reached out to the pathologist to gather their thoughts as far as possible incorrect pathology report.  As far as proctitis goes, could be underlying UC though also with risk factors for colitis in the setting of chronic NSAID use.  As patient is totally asymptomatic we will monitor for now with plans to repeat flexible sigmoidoscopy in approximately 3 months with repeat biopsies.  Counseled to limit NSAID use as best as he can.  Congratulated him on his weight loss.  Follow-up in 3 months  04/25/2023 4:45 PM   Disclaimer: This note was dictated with voice recognition software. Similar sounding words can inadvertently be transcribed and may not be corrected upon review.

## 2023-12-11 ENCOUNTER — Other Ambulatory Visit: Payer: Self-pay | Admitting: Rehabilitation

## 2023-12-11 DIAGNOSIS — M5416 Radiculopathy, lumbar region: Secondary | ICD-10-CM

## 2024-01-06 ENCOUNTER — Inpatient Hospital Stay: Admission: RE | Admit: 2024-01-06 | Source: Ambulatory Visit

## 2024-01-28 ENCOUNTER — Ambulatory Visit
Admission: RE | Admit: 2024-01-28 | Discharge: 2024-01-28 | Disposition: A | Discharge status: Home / Self Care | Source: Ambulatory Visit | Attending: Rehabilitation | Admitting: Rehabilitation

## 2024-01-28 DIAGNOSIS — M5416 Radiculopathy, lumbar region: Secondary | ICD-10-CM

## 2024-01-28 MED ORDER — GADOPICLENOL 0.5 MMOL/ML IV SOLN
10.0000 mL | Freq: Once | INTRAVENOUS | Status: AC | PRN
  Administered 2024-01-28: 10 mL via INTRAVENOUS

## 2024-04-03 ENCOUNTER — Encounter (HOSPITAL_COMMUNITY): Payer: Self-pay | Admitting: Family Medicine

## 2024-04-03 DIAGNOSIS — R935 Abnormal findings on diagnostic imaging of other abdominal regions, including retroperitoneum: Secondary | ICD-10-CM

## 2024-04-04 ENCOUNTER — Other Ambulatory Visit (HOSPITAL_COMMUNITY): Payer: Self-pay | Admitting: Family Medicine

## 2024-04-04 DIAGNOSIS — R935 Abnormal findings on diagnostic imaging of other abdominal regions, including retroperitoneum: Secondary | ICD-10-CM
